# Patient Record
Sex: Female | Born: 1960 | Hispanic: Yes | Smoking: Former smoker
Health system: Southern US, Community
[De-identification: ages and names within clinical notes are randomized; demographics above are authoritative.]

## PROBLEM LIST (undated history)

## (undated) DIAGNOSIS — L509 Urticaria, unspecified: Secondary | ICD-10-CM

## (undated) DIAGNOSIS — I10 Essential (primary) hypertension: Secondary | ICD-10-CM

## (undated) DIAGNOSIS — E119 Type 2 diabetes mellitus without complications: Secondary | ICD-10-CM

## (undated) DIAGNOSIS — E1140 Type 2 diabetes mellitus with diabetic neuropathy, unspecified: Secondary | ICD-10-CM

## (undated) DIAGNOSIS — E782 Mixed hyperlipidemia: Secondary | ICD-10-CM

## (undated) DIAGNOSIS — B009 Herpesviral infection, unspecified: Secondary | ICD-10-CM

## (undated) DIAGNOSIS — J309 Allergic rhinitis, unspecified: Secondary | ICD-10-CM

## (undated) DIAGNOSIS — A6004 Herpesviral vulvovaginitis: Secondary | ICD-10-CM

## (undated) DIAGNOSIS — E7800 Pure hypercholesterolemia, unspecified: Secondary | ICD-10-CM

## (undated) DIAGNOSIS — I729 Aneurysm of unspecified site: Secondary | ICD-10-CM

## (undated) DIAGNOSIS — B349 Viral infection, unspecified: Secondary | ICD-10-CM

## (undated) DIAGNOSIS — E039 Hypothyroidism, unspecified: Secondary | ICD-10-CM

## (undated) DIAGNOSIS — M25562 Pain in left knee: Secondary | ICD-10-CM

## (undated) HISTORY — DX: Type 2 diabetes mellitus with diabetic neuropathy, unspecified: E11.40

## (undated) HISTORY — DX: Allergic rhinitis, unspecified: J30.9

## (undated) HISTORY — DX: Urticaria, unspecified: L50.9

## (undated) HISTORY — DX: Mixed hyperlipidemia: E78.2

## (undated) HISTORY — DX: Pure hypercholesterolemia, unspecified: E78.00

## (undated) HISTORY — DX: Essential (primary) hypertension: I10

## (undated) HISTORY — DX: Type 2 diabetes mellitus without complications: E11.9

## (undated) HISTORY — DX: Viral infection, unspecified: B34.9

## (undated) HISTORY — DX: Herpesviral vulvovaginitis: A60.04

## (undated) HISTORY — DX: Herpesviral infection, unspecified: B00.9

## (undated) HISTORY — DX: Aneurysm of unspecified site: I72.9

## (undated) HISTORY — DX: Hypothyroidism, unspecified: E03.9

## (undated) HISTORY — DX: Pain in left knee: M25.562

---

## 2019-10-16 ENCOUNTER — Ambulatory Visit: Payer: Medicaid Other | Attending: Internal Medicine

## 2021-07-10 ENCOUNTER — Other Ambulatory Visit: Payer: Self-pay | Admitting: Family Medicine

## 2021-07-10 DIAGNOSIS — M5136 Other intervertebral disc degeneration, lumbar region: Secondary | ICD-10-CM

## 2021-07-17 ENCOUNTER — Ambulatory Visit
Admission: RE | Admit: 2021-07-17 | Discharge: 2021-07-17 | Disposition: A | Discharge status: Home / Self Care | Payer: Medicaid Other | Source: Ambulatory Visit | Attending: Family Medicine | Admitting: Family Medicine

## 2021-07-17 ENCOUNTER — Other Ambulatory Visit: Payer: Self-pay

## 2021-07-17 DIAGNOSIS — M5136 Other intervertebral disc degeneration, lumbar region: Secondary | ICD-10-CM | POA: Diagnosis present

## 2021-10-19 DIAGNOSIS — M2569 Stiffness of other specified joint, not elsewhere classified: Secondary | ICD-10-CM | POA: Insufficient documentation

## 2021-10-19 DIAGNOSIS — M545 Low back pain, unspecified: Secondary | ICD-10-CM | POA: Insufficient documentation

## 2021-10-24 ENCOUNTER — Ambulatory Visit: Payer: Medicaid Other | Admitting: Cardiovascular Disease

## 2021-10-25 ENCOUNTER — Encounter: Payer: Self-pay | Admitting: Cardiovascular Disease

## 2021-10-25 NOTE — Progress Notes (Unsigned)
NO SHOW

## 2022-05-02 DIAGNOSIS — M51369 Other intervertebral disc degeneration, lumbar region without mention of lumbar back pain or lower extremity pain: Secondary | ICD-10-CM | POA: Insufficient documentation

## 2022-05-02 DIAGNOSIS — M5136 Other intervertebral disc degeneration, lumbar region: Secondary | ICD-10-CM | POA: Insufficient documentation

## 2022-05-02 DIAGNOSIS — M5416 Radiculopathy, lumbar region: Secondary | ICD-10-CM | POA: Insufficient documentation

## 2022-05-02 DIAGNOSIS — M48061 Spinal stenosis, lumbar region without neurogenic claudication: Secondary | ICD-10-CM | POA: Insufficient documentation

## 2022-05-20 ENCOUNTER — Emergency Department: Payer: Medicaid Other

## 2022-05-20 ENCOUNTER — Emergency Department
Admission: EM | Admit: 2022-05-20 | Discharge: 2022-05-20 | Disposition: A | Discharge status: Home / Self Care | Payer: Medicaid Other | Attending: Emergency Medicine | Admitting: Emergency Medicine

## 2022-05-20 ENCOUNTER — Other Ambulatory Visit: Payer: Self-pay

## 2022-05-20 DIAGNOSIS — M545 Low back pain, unspecified: Secondary | ICD-10-CM

## 2022-05-20 DIAGNOSIS — M5441 Lumbago with sciatica, right side: Secondary | ICD-10-CM | POA: Insufficient documentation

## 2022-05-20 DIAGNOSIS — R109 Unspecified abdominal pain: Secondary | ICD-10-CM | POA: Diagnosis not present

## 2022-05-20 DIAGNOSIS — G8929 Other chronic pain: Secondary | ICD-10-CM | POA: Diagnosis not present

## 2022-05-20 LAB — COMPREHENSIVE METABOLIC PANEL
ALT: 14 U/L (ref 0–44)
AST: 20 U/L (ref 15–41)
Albumin: 3.8 g/dL (ref 3.5–5.0)
Alkaline Phosphatase: 45 U/L (ref 38–126)
Anion gap: 8 (ref 5–15)
BUN: 24 mg/dL — ABNORMAL HIGH (ref 8–23)
CO2: 24 mmol/L (ref 22–32)
Calcium: 8.6 mg/dL — ABNORMAL LOW (ref 8.9–10.3)
Chloride: 104 mmol/L (ref 98–111)
Creatinine, Ser: 0.91 mg/dL (ref 0.44–1.00)
GFR, Estimated: >60 mL/min (ref >60)
Glucose, Bld: 156 mg/dL — ABNORMAL HIGH (ref 70–99)
Potassium: 3.6 mmol/L (ref 3.5–5.1)
Sodium: 136 mmol/L (ref 135–145)
Total Bilirubin: 0.7 mg/dL (ref 0.3–1.2)
Total Protein: 6.7 g/dL (ref 6.5–8.1)

## 2022-05-20 LAB — CBC WITH DIFFERENTIAL/PLATELET
Abs Immature Granulocytes: 0.04 10*3/uL (ref 0.00–0.07)
Basophils Absolute: 0 10*3/uL (ref 0.0–0.1)
Basophils Relative: 0 %
Eosinophils Absolute: 0.2 10*3/uL (ref 0.0–0.5)
Eosinophils Relative: 2 %
HCT: 47.6 % — ABNORMAL HIGH (ref 36.0–46.0)
Hemoglobin: 15.3 g/dL — ABNORMAL HIGH (ref 12.0–15.0)
Immature Granulocytes: 0 %
Lymphocytes Relative: 36 %
Lymphs Abs: 3.3 10*3/uL (ref 0.7–4.0)
MCH: 29.6 pg (ref 26.0–34.0)
MCHC: 32.1 g/dL (ref 30.0–36.0)
MCV: 92.1 fL (ref 80.0–100.0)
Monocytes Absolute: 0.7 10*3/uL (ref 0.1–1.0)
Monocytes Relative: 8 %
Neutro Abs: 4.9 10*3/uL (ref 1.7–7.7)
Neutrophils Relative %: 54 %
Platelets: 202 10*3/uL (ref 150–400)
RBC: 5.17 MIL/uL — ABNORMAL HIGH (ref 3.87–5.11)
RDW: 14.1 % (ref 11.5–15.5)
WBC: 9.2 10*3/uL (ref 4.0–10.5)
nRBC: 0 % (ref 0.0–0.2)

## 2022-05-20 LAB — URINALYSIS, ROUTINE W REFLEX MICROSCOPIC
Bacteria, UA: NONE SEEN
Bilirubin Urine: NEGATIVE
Glucose, UA: >=500 mg/dL — AB
Hgb urine dipstick: NEGATIVE
Ketones, ur: NEGATIVE mg/dL
Leukocytes,Ua: NEGATIVE
Nitrite: NEGATIVE
Protein, ur: NEGATIVE mg/dL
Specific Gravity, Urine: 1.021 (ref 1.005–1.030)
Squamous Epithelial / HPF: NONE SEEN (ref 0–5)
pH: 5 (ref 5.0–8.0)

## 2022-05-20 MED ORDER — METHOCARBAMOL 500 MG PO TABS
500.0000 mg | ORAL_TABLET | Freq: Once | ORAL | Status: AC
  Administered 2022-05-20: 500 mg via ORAL
  Filled 2022-05-20 (×2): qty 1

## 2022-05-20 MED ORDER — KETOROLAC TROMETHAMINE 30 MG/ML IJ SOLN
30.0000 mg | Freq: Once | INTRAMUSCULAR | Status: AC
  Administered 2022-05-20: 30 mg via INTRAMUSCULAR
  Filled 2022-05-20: qty 1

## 2022-05-20 MED ORDER — ACETAMINOPHEN 500 MG PO TABS
1000.0000 mg | ORAL_TABLET | Freq: Once | ORAL | Status: AC
  Administered 2022-05-20: 1000 mg via ORAL
  Filled 2022-05-20: qty 2

## 2022-05-20 MED ORDER — METHOCARBAMOL 500 MG PO TABS: 500.0000 mg | ORAL_TABLET | Freq: Three times a day (TID) | ORAL | 0 refills | Status: DC | PRN

## 2022-05-20 MED ORDER — LIDOCAINE 5 % EX PTCH
1.0000 | MEDICATED_PATCH | CUTANEOUS | Status: DC
  Administered 2022-05-20: 1 via TRANSDERMAL
  Filled 2022-05-20: qty 1

## 2022-05-20 MED ORDER — LIDOCAINE 5 % EX PTCH: 1.0000 | MEDICATED_PATCH | Freq: Two times a day (BID) | CUTANEOUS | 0 refills | Status: AC

## 2022-05-20 NOTE — ED Provider Notes (Signed)
[facility] Provider Note    Event Date/Time   First MD Initiated Contact with Patient 05/20/22 0121     (approximate)   History   Flank Pain   HPI  Zemirah Krasinski is a 61 y.o. female who presents to the ED for evaluation of back Pain   I reviewed orthopedic visit from 10/13.  Obese patient with history of chronic low back pain with right-sided sciatica.  Lumbar spinal stenosis on MRI lumbar spine from 10 months ago.  On 10/13, she reports getting a spinal injection for this.  She presents to the ED tonight, accompanied by her daughter, for evaluation of acute on chronic right-sided lumbar pain.  She was noted to be triaged as "flank pain" but she is gesturing to her right-sided lumbar back and reports "the pain went away for couple days after the injection, but came right back."  Denies any falls or injuries, fevers or urinary or stool retention.  No saddle anesthesias.  Reports the pain was becoming unbearable today.  Physical Exam   Triage Vital Signs: ED Triage Vitals  Enc Vitals Group     BP 05/20/22 0045 99/66     Pulse Rate 05/20/22 0045 89     Resp 05/20/22 0045 16     Temp 05/20/22 0045 98.2 F (36.8 C)     Temp Source 05/20/22 0045 Oral     SpO2 05/20/22 0045 96 %     Weight 05/20/22 0043 140 lb (63.5 kg)     Height 05/20/22 0043 5\' 2"  (1.575 m)     Head Circumference --      Peak Flow --      Pain Score 05/20/22 0043 5     Pain Loc --      Pain Edu? --      Excl. in [place]? --     Most recent vital signs: Vitals:   05/20/22 0045 05/20/22 0310  BP: 99/66 116/66  Pulse: 89 84  Resp: 16 16  Temp: 98.2 F (36.8 C)   SpO2: 96% 98%    General: Awake, no distress.  Obese, seems uncomfortable. CV:  Good peripheral perfusion.  Resp:  Normal effort.  Abd:  No distention.  MSK:  No deformity noted.  No signs of trauma or skin changes.  Right-sided paraspinal lumbar tenderness is noted.  No midline tenderness or step-offs  Neuro:  No  focal deficits appreciated. Other:     ED Results / Procedures / Treatments   Labs (all labs ordered are listed, but only abnormal results are displayed) Labs Reviewed  CBC WITH DIFFERENTIAL/PLATELET - Abnormal; Notable for the following components:      Result Value   RBC 5.17 (*)    Hemoglobin 15.3 (*)    HCT 47.6 (*)    All other components within normal limits  COMPREHENSIVE METABOLIC PANEL - Abnormal; Notable for the following components:   Glucose, Bld 156 (*)    BUN 24 (*)    Calcium 8.6 (*)    All other components within normal limits  URINALYSIS, ROUTINE W REFLEX MICROSCOPIC - Abnormal; Notable for the following components:   Color, Urine YELLOW (*)    APPearance CLEAR (*)    Glucose, UA >=500 (*)    All other components within normal limits  POC URINE PREG, ED    EKG   RADIOLOGY CT renal study interpreted by me without evidence of acute pathology or urologic stone  Official radiology report(s): CT Renal Stone  Study  Result Date: 05/20/2022 CLINICAL DATA:  Flank pain.  Concern for kidney stone. EXAM: CT ABDOMEN AND PELVIS WITHOUT CONTRAST TECHNIQUE: Multidetector CT imaging of the abdomen and pelvis was performed following the standard protocol without IV contrast. RADIATION DOSE REDUCTION: This exam was performed according to the departmental dose-optimization program which includes automated exposure control, adjustment of the mA and/or kV according to patient size and/or use of iterative reconstruction technique. COMPARISON:  None Available. FINDINGS: Evaluation of this exam is limited in the absence of intravenous contrast. Lower chest: The visualized lung bases are clear. There is coronary vascular calcification. No intra-abdominal free air or free fluid. Hepatobiliary: The liver is unremarkable. No biliary dilatation. The gallbladder is contracted. No calcified gallstone or pericholecystic fluid. Pancreas: Unremarkable. No pancreatic ductal dilatation or  surrounding inflammatory changes. Spleen: Normal in size without focal abnormality. Adrenals/Urinary Tract: The adrenal glands are unremarkable. There is no hydronephrosis or nephrolithiasis on either side. The visualized ureters and urinary bladder appear unremarkable. Stomach/Bowel: There is moderate stool throughout the colon. There is sigmoid diverticulosis and scattered colonic diverticula. No acute inflammatory changes. There is no bowel obstruction or active inflammation. The appendix is normal. Vascular/Lymphatic: Mild aortoiliac atherosclerotic disease. The IVC is unremarkable. No portal venous gas. There is no adenopathy. Reproductive: The uterus is grossly unremarkable. No adnexal masses. Other: Midline vertical anterior pelvic wall incisional scar. Musculoskeletal: Degenerative changes of the spine. No acute osseous pathology. IMPRESSION: 1. No acute intra-abdominal or pelvic pathology. No hydronephrosis or nephrolithiasis. 2. Colonic diverticulosis. No bowel obstruction. Normal appendix. 3.  Aortic Atherosclerosis (ICD10-I70.0). Electronically Signed   By: Anner Crete M.D.   On: 05/20/2022 01:10    PROCEDURES and INTERVENTIONS:  Procedures  Medications  lidocaine (LIDODERM) 5 % 1 patch (1 patch Transdermal Patch Applied 05/20/22 0157)  acetaminophen (TYLENOL) tablet 1,000 mg (1,000 mg Oral Given 05/20/22 0156)  ketorolac (TORADOL) 30 MG/ML injection 30 mg (30 mg Intramuscular Given 05/20/22 0158)  methocarbamol (ROBAXIN) tablet 500 mg (500 mg Oral Given 05/20/22 0157)     IMPRESSION / MDM / ASSESSMENT AND PLAN / ED COURSE  I reviewed the triage vital signs and the nursing notes.  Differential diagnosis includes, but is not limited to, chronic pain, cauda equina, iatrogenic injury from injection, renal stone  {Patient presents with symptoms of an acute illness or injury that is potentially life-threatening.  61 year old woman presents with acute on chronic atraumatic back pain  suitable for outpatient management with nonnarcotic multimodal analgesia.  She looks well.  No signs of neurologic vascular deficits.  No signs of red flag features.  Screening blood work is benign with no leukocytosis or signs of sepsis.  Metabolic panel is essentially normal.  Urine without infectious features or stigmata of ureteral stones.  CT renal studies ordered from triage and without evidence of acute intra-abdominal or any ureteral pathology.  I suspect chronic MSK pain.  Improving pain with multimodal analgesia.  Discharged with the same.  Discussed return precautions.      FINAL CLINICAL IMPRESSION(S) / ED DIAGNOSES   Final diagnoses:  Right lumbar pain  Chronic right-sided low back pain with right-sided sciatica     Rx / DC Orders   ED Discharge Orders          Ordered    methocarbamol (ROBAXIN) 500 MG tablet  Every 8 hours PRN        05/20/22 0302    lidocaine (LIDODERM) 5 %  Every 12 hours  05/20/22 0302             Note:  This document was prepared using Dragon voice recognition software and may include unintentional dictation errors.   Vladimir Crofts, MD 05/20/22 [phone]

## 2022-05-20 NOTE — ED Triage Notes (Signed)
Pt arrives with c/o right sided flank pain that started about 2 days ago. Pt denies n/v or fevers.

## 2022-05-20 NOTE — Discharge Instructions (Signed)
Please take Tylenol and ibuprofen/Advil for your pain.  It is safe to take them together, or to alternate them every few hours.  Take up to 1000mg  of Tylenol at a time, up to 4 times per day.  Do not take more than 4000 mg of Tylenol in 24 hours.  For ibuprofen, take 400-600 mg, 3 - 4 times per day.  Please use lidocaine patches at your site of pain.  Apply 1 patch at a time, leave on for 12 hours, then remove for 12 hours.  12 hours on, 12 hours off.  Do not apply more than 1 patch at a time.  Use Robaxin muscle relaxers 3-4 times per day as needed for more severe/breakthrough pain.  I would reserve the hydrocodone pain medicine for the most severe pain.  Be careful mixing hydrocodone and Robaxin, this commendation can make you sleepy.

## 2022-05-20 NOTE — ED Notes (Signed)
ED Provider at bedside. 

## 2022-05-20 NOTE — ED Notes (Signed)
Signing pad did not work, pt verbalized understanding of Dc instructions. 

## 2022-06-15 ENCOUNTER — Inpatient Hospital Stay: Payer: Medicaid Other

## 2022-06-15 ENCOUNTER — Inpatient Hospital Stay: Payer: Medicaid Other | Attending: Oncology | Admitting: Oncology

## 2022-06-15 ENCOUNTER — Encounter: Payer: Self-pay | Admitting: Oncology

## 2022-06-15 VITALS — BP 123/75 | HR 79 | Temp 96.9°F | Resp 18 | Wt 141.2 lb

## 2022-06-15 DIAGNOSIS — E039 Hypothyroidism, unspecified: Secondary | ICD-10-CM | POA: Insufficient documentation

## 2022-06-15 DIAGNOSIS — D751 Secondary polycythemia: Secondary | ICD-10-CM

## 2022-06-15 DIAGNOSIS — Z7982 Long term (current) use of aspirin: Secondary | ICD-10-CM | POA: Insufficient documentation

## 2022-06-15 DIAGNOSIS — E1140 Type 2 diabetes mellitus with diabetic neuropathy, unspecified: Secondary | ICD-10-CM | POA: Diagnosis not present

## 2022-06-15 DIAGNOSIS — Z7984 Long term (current) use of oral hypoglycemic drugs: Secondary | ICD-10-CM | POA: Diagnosis not present

## 2022-06-15 DIAGNOSIS — I10 Essential (primary) hypertension: Secondary | ICD-10-CM | POA: Insufficient documentation

## 2022-06-15 DIAGNOSIS — E782 Mixed hyperlipidemia: Secondary | ICD-10-CM | POA: Insufficient documentation

## 2022-06-15 LAB — CBC WITH DIFFERENTIAL/PLATELET
Abs Immature Granulocytes: 0.02 10*3/uL (ref 0.00–0.07)
Basophils Absolute: 0 10*3/uL (ref 0.0–0.1)
Basophils Relative: 0 %
Eosinophils Absolute: 0.2 10*3/uL (ref 0.0–0.5)
Eosinophils Relative: 2 %
HCT: 50.7 % — ABNORMAL HIGH (ref 36.0–46.0)
Hemoglobin: 16.7 g/dL — ABNORMAL HIGH (ref 12.0–15.0)
Immature Granulocytes: 0 %
Lymphocytes Relative: 39 %
Lymphs Abs: 3.1 10*3/uL (ref 0.7–4.0)
MCH: 30 pg (ref 26.0–34.0)
MCHC: 32.9 g/dL (ref 30.0–36.0)
MCV: 91 fL (ref 80.0–100.0)
Monocytes Absolute: 0.5 10*3/uL (ref 0.1–1.0)
Monocytes Relative: 7 %
Neutro Abs: 4 10*3/uL (ref 1.7–7.7)
Neutrophils Relative %: 52 %
Platelets: 184 10*3/uL (ref 150–400)
RBC: 5.57 MIL/uL — ABNORMAL HIGH (ref 3.87–5.11)
RDW: 14.3 % (ref 11.5–15.5)
WBC: 7.8 10*3/uL (ref 4.0–10.5)
nRBC: 0 % (ref 0.0–0.2)

## 2022-06-15 LAB — URINALYSIS, COMPLETE (UACMP) WITH MICROSCOPIC
Bacteria, UA: NONE SEEN
Bilirubin Urine: NEGATIVE
Glucose, UA: >=500 mg/dL — AB
Hgb urine dipstick: NEGATIVE
Ketones, ur: NEGATIVE mg/dL
Leukocytes,Ua: NEGATIVE
Nitrite: NEGATIVE
Protein, ur: NEGATIVE mg/dL
Specific Gravity, Urine: 1.01 (ref 1.005–1.030)
pH: 6 (ref 5.0–8.0)

## 2022-06-15 NOTE — Progress Notes (Signed)
Hematology/Oncology Consult note [facility] Telephone:([phone] [phone]  Patient Care Team: [place] as PCP - General   Name of the patient: Ashley Delgado  [id number]  1961-02-24    Reason for referral-polycythemia   Referring physician-Susan Magnus Ivan, MD  Date of visit: 06/15/22   History of presenting illness-  patient is a 61 year old female with a past medical history significant for hypothyroidism, hypertension type 2 diabetes who has been referred for polycythemia.  Most recent CBC from 05/20/2022 showed a white count of 9.2, H&H of 15.3/47.6 and a platelet count of 202.  CMP was within normal limits.  She was noted to have an H&H of 17.8/51.5 on 05/22/2022.  White count and platelets were normal.  Patient is a lifelong non-smoker.  She reports getting a restful sleep and does not snore.  She does however wake up a few times at night mainly to use the restroom as she is on water pills as well as drinks water at night.  Denies any daytime drowsiness or fatigue.  No known baseline lung disease.  ECOG PS- 0  Pain scale- 0   Review of systems- Review of Systems  Constitutional:  Negative for chills, fever, malaise/fatigue and weight loss.  HENT:  Negative for congestion, ear discharge and nosebleeds.   Eyes:  Negative for blurred vision.  Respiratory:  Negative for cough, hemoptysis, sputum production, shortness of breath and wheezing.   Cardiovascular:  Negative for chest pain, palpitations, orthopnea and claudication.  Gastrointestinal:  Negative for abdominal pain, blood in stool, constipation, diarrhea, heartburn, melena, nausea and vomiting.  Genitourinary:  Negative for dysuria, flank pain, frequency, hematuria and urgency.  Musculoskeletal:  Negative for back pain, joint pain and myalgias.  Skin:  Negative for rash.  Neurological:  Negative for dizziness, tingling, focal weakness, seizures, weakness and headaches.   Endo/Heme/Allergies:  Does not bruise/bleed easily.  Psychiatric/Behavioral:  Negative for depression and suicidal ideas. The patient does not have insomnia.     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 RECORDS   Pain in left knee    FROM [facility] RECORDS   Pure hypercholesterolemia, unspecified    FROM [facility] RECORDS   Type 2 diabetes mellitus with diabetic neuropathy ([place])    FROM [facility] RECORDS   Type 2 diabetes mellitus without complication ([place])    FROM [facility] RECORDS   Urticaria, unspecified    FROM [facility] RECORDS   Viral infection    FROM [facility] RECORDS     History reviewed. No pertinent surgical history.  Social History   Socioeconomic History   Marital status: Unknown    Spouse name: Not on file   Number of children: Not on file   Years of education: Not on file   Highest education level: Not on  file  Occupational History   Not on file  Tobacco Use   Smoking status: Former    Packs/day: 1.00    Types: Cigarettes   Smokeless tobacco: Never  Substance and Sexual Activity   Alcohol use: Yes    Comment: occasional drinking   Drug use: Not on file   Sexual activity: Yes  Other Topics Concern   Not on file  Social History Narrative   Not on file   Social Determinants of Health   Financial Resource Strain: Not on file  Food Insecurity: Not on file   Transportation Needs: Not on file  Physical Activity: Not on file  Stress: Not on file  Social Connections: Not on file  Intimate Partner Violence: Not on file     Family History  Problem Relation Age of Onset   Heart attack Mother    Diabetes Mother    Hypertension Mother    Heart failure Father    Stroke Father    Diabetes Father    Hypertension Father    Hypertension Brother    Stroke Brother    Heart attack Brother    Leukemia Daughter    Thyroid cancer Daughter      Current Outpatient Medications:    acyclovir (ZOVIRAX) 400 MG tablet, Take 400 mg by mouth daily., Disp: , Rfl:    aspirin EC 81 MG tablet, Take 81 mg by mouth daily. Swallow whole., Disp: , Rfl:    gabapentin (NEURONTIN) 300 MG capsule, one capsule twice a day, Disp: , Rfl:    hydrochlorothiazide (HYDRODIURIL) 25 MG tablet, Take 25 mg by mouth daily., Disp: , Rfl:    HYDROcodone-acetaminophen (NORCO/VICODIN) 5-325 MG tablet, , Disp: , Rfl:    JARDIANCE 25 MG TABS tablet, Take 25 mg by mouth daily., Disp: , Rfl:    levothyroxine (SYNTHROID) 112 MCG tablet, Take 112 mcg by mouth daily., Disp: , Rfl:    lisinopril (ZESTRIL) 40 MG tablet, , Disp: , Rfl:    metFORMIN (GLUCOPHAGE) 1000 MG tablet, Take 1,000 mg by mouth 2 (two) times daily., Disp: , Rfl:    methocarbamol (ROBAXIN) 500 MG tablet, Take 1 tablet (500 mg total) by mouth every 8 (eight) hours as needed for muscle spasms., Disp: 20 tablet, Rfl: 0   rosuvastatin (CRESTOR) 10 MG tablet, Take 10 mg by mouth at bedtime., Disp: , Rfl:    TRULICITY 4.5 MG/0.5ML SOPN, Inject into the skin., Disp: , Rfl:    lidocaine (LIDODERM) 5 %, Place 1 patch onto the skin every 12 (twelve) hours. Remove & Discard patch within 12 hours or as directed by MD, Disp: 10 patch, Rfl: 0   meloxicam (MOBIC) 15 MG tablet, Take 1 tablet every day by oral route. (Patient not taking: Reported on 06/15/2022), Disp: , Rfl:    mirtazapine (REMERON) 30 MG tablet, as needed. (Patient not  taking: Reported on 06/15/2022), Disp: , Rfl:    montelukast (SINGULAIR) 10 MG tablet, , Disp: , Rfl:    pantoprazole (PROTONIX) 40 MG tablet, , Disp: , Rfl:    TOUJEO SOLOSTAR 300 UNIT/ML Solostar Pen, Inject into the skin., Disp: , Rfl:    Physical exam:  Vitals:   06/15/22 1108  BP: 123/75  Pulse: 79  Resp: 18  Temp: (!) 96.9 F (36.1 C)  SpO2: 99%  Weight: 141 lb 3.2 oz (64 kg)   Physical Exam Cardiovascular:     Rate and Rhythm: Normal rate and regular rhythm.     Heart sounds: Normal  heart sounds.  Pulmonary:     Effort: Pulmonary effort is normal.     Breath sounds: Normal breath sounds.  Abdominal:     General: Bowel sounds are normal.     Palpations: Abdomen is soft.     Comments: No palpable hepatosplenomegaly  Skin:    General: Skin is warm and dry.  Neurological:     Mental Status: She is alert and oriented to person, place, and time.           Latest Ref Rng & Units 05/20/2022   12:45 AM  CMP  Glucose 70 - 99 mg/dL 156   BUN 8 - 23 mg/dL 24   Creatinine 0.44 - 1.00 mg/dL 0.91   Sodium 135 - 145 mmol/L 136   Potassium 3.5 - 5.1 mmol/L 3.6   Chloride 98 - 111 mmol/L 104   CO2 22 - 32 mmol/L 24   Calcium 8.9 - 10.3 mg/dL 8.6   Total Protein 6.5 - 8.1 g/dL 6.7   Total Bilirubin 0.3 - 1.2 mg/dL 0.7   Alkaline Phos 38 - 126 U/L 45   AST 15 - 41 U/L 20   ALT 0 - 44 U/L 14       Latest Ref Rng & Units 05/20/2022   12:45 AM  CBC  WBC 4.0 - 10.5 K/uL 9.2   Hemoglobin 12.0 - 15.0 g/dL 15.3   Hematocrit 36.0 - 46.0 % 47.6   Platelets 150 - 400 K/uL 202     No images are attached to the encounter.  CT Renal Stone Study  Result Date: 05/20/2022 CLINICAL DATA:  Flank pain.  Concern for kidney stone. EXAM: CT ABDOMEN AND PELVIS WITHOUT CONTRAST TECHNIQUE: Multidetector CT imaging of the abdomen and pelvis was performed following the standard protocol without IV contrast. RADIATION DOSE REDUCTION: This exam was performed according to the departmental  dose-optimization program which includes automated exposure control, adjustment of the mA and/or kV according to patient size and/or use of iterative reconstruction technique. COMPARISON:  None Available. FINDINGS: Evaluation of this exam is limited in the absence of intravenous contrast. Lower chest: The visualized lung bases are clear. There is coronary vascular calcification. No intra-abdominal free air or free fluid. Hepatobiliary: The liver is unremarkable. No biliary dilatation. The gallbladder is contracted. No calcified gallstone or pericholecystic fluid. Pancreas: Unremarkable. No pancreatic ductal dilatation or surrounding inflammatory changes. Spleen: Normal in size without focal abnormality. Adrenals/Urinary Tract: The adrenal glands are unremarkable. There is no hydronephrosis or nephrolithiasis on either side. The visualized ureters and urinary bladder appear unremarkable. Stomach/Bowel: There is moderate stool throughout the colon. There is sigmoid diverticulosis and scattered colonic diverticula. No acute inflammatory changes. There is no bowel obstruction or active inflammation. The appendix is normal. Vascular/Lymphatic: Mild aortoiliac atherosclerotic disease. The IVC is unremarkable. No portal venous gas. There is no adenopathy. Reproductive: The uterus is grossly unremarkable. No adnexal masses. Other: Midline vertical anterior pelvic wall incisional scar. Musculoskeletal: Degenerative changes of the spine. No acute osseous pathology. IMPRESSION: 1. No acute intra-abdominal or pelvic pathology. No hydronephrosis or nephrolithiasis. 2. Colonic diverticulosis. No bowel obstruction. Normal appendix. 3.  Aortic Atherosclerosis (ICD10-I70.0). Electronically Signed   By: Anner Crete M.D.   On: 05/20/2022 01:10    Assessment and plan- Patient is a 61 y.o. female referred for polycythemia  Unclear if polycythemia is real or spurious given that patient's hemoglobin was 15.3 on 05/20/2022 and  just 2 days later it was 17.8.  I plan to  repeat his CBC with differential at this time along with JAK2 levels and EPO levels and urinalysis.  In person or video visit with me in 3 weeks time.  Discussed differences between polycythemia vera and secondary polycythemia.   Thank you for this kind referral and the opportunity to participate in the care of this patient   Visit Diagnosis 1. Polycythemia     Dr. Randa Evens, MD, MPH [facility] at [facility] [id number] 06/15/2022 2:25 PM

## 2022-06-16 LAB — ERYTHROPOIETIN: Erythropoietin: 14.7 m[IU]/mL (ref 2.6–18.5)

## 2022-06-27 LAB — JAK2 GENOTYPR

## 2022-07-09 ENCOUNTER — Inpatient Hospital Stay: Payer: Medicaid Other | Attending: Oncology | Admitting: Oncology

## 2022-07-09 ENCOUNTER — Encounter: Payer: Self-pay | Admitting: Oncology

## 2022-07-31 ENCOUNTER — Inpatient Hospital Stay: Payer: Medicaid Other

## 2022-07-31 ENCOUNTER — Inpatient Hospital Stay: Payer: Medicaid Other | Attending: Oncology | Admitting: Oncology

## 2022-07-31 VITALS — BP 126/77 | HR 88 | Temp 98.6°F | Ht 62.0 in | Wt 138.8 lb

## 2022-07-31 DIAGNOSIS — I10 Essential (primary) hypertension: Secondary | ICD-10-CM | POA: Diagnosis not present

## 2022-07-31 DIAGNOSIS — Z7984 Long term (current) use of oral hypoglycemic drugs: Secondary | ICD-10-CM | POA: Insufficient documentation

## 2022-07-31 DIAGNOSIS — Z79899 Other long term (current) drug therapy: Secondary | ICD-10-CM | POA: Diagnosis not present

## 2022-07-31 DIAGNOSIS — E039 Hypothyroidism, unspecified: Secondary | ICD-10-CM | POA: Diagnosis not present

## 2022-07-31 DIAGNOSIS — D751 Secondary polycythemia: Secondary | ICD-10-CM

## 2022-07-31 DIAGNOSIS — E782 Mixed hyperlipidemia: Secondary | ICD-10-CM | POA: Diagnosis not present

## 2022-07-31 DIAGNOSIS — E1140 Type 2 diabetes mellitus with diabetic neuropathy, unspecified: Secondary | ICD-10-CM | POA: Insufficient documentation

## 2022-07-31 LAB — CBC WITH DIFFERENTIAL/PLATELET
Abs Immature Granulocytes: 0.02 10*3/uL (ref 0.00–0.07)
Basophils Absolute: 0 10*3/uL (ref 0.0–0.1)
Basophils Relative: 0 %
Eosinophils Absolute: 0.3 10*3/uL (ref 0.0–0.5)
Eosinophils Relative: 4 %
HCT: 48.2 % — ABNORMAL HIGH (ref 36.0–46.0)
Hemoglobin: 16 g/dL — ABNORMAL HIGH (ref 12.0–15.0)
Immature Granulocytes: 0 %
Lymphocytes Relative: 42 %
Lymphs Abs: 3 10*3/uL (ref 0.7–4.0)
MCH: 29.8 pg (ref 26.0–34.0)
MCHC: 33.2 g/dL (ref 30.0–36.0)
MCV: 89.8 fL (ref 80.0–100.0)
Monocytes Absolute: 0.5 10*3/uL (ref 0.1–1.0)
Monocytes Relative: 7 %
Neutro Abs: 3.3 10*3/uL (ref 1.7–7.7)
Neutrophils Relative %: 47 %
Platelets: 175 10*3/uL (ref 150–400)
RBC: 5.37 MIL/uL — ABNORMAL HIGH (ref 3.87–5.11)
RDW: 14.4 % (ref 11.5–15.5)
WBC: 7.2 10*3/uL (ref 4.0–10.5)
nRBC: 0 % (ref 0.0–0.2)

## 2022-08-01 ENCOUNTER — Encounter: Payer: Self-pay | Admitting: Oncology

## 2022-08-01 NOTE — Progress Notes (Signed)
Hematology/Oncology Consult note [facility]  Telephone:([phone] [phone]  Patient Care Team: [place], [place] as PCP - General   Name of the patient: Ashley Delgado  [id number]  [birth year]   Date of visit: 08/01/22  Diagnosis-secondary polycythemia of unclear etiology  Chief complaint/ Reason for visit-routine follow-up of polycythemia  Heme/Onc history: patient is a 62 year old female with a past medical history significant for hypothyroidism, hypertension type 2 diabetes who has been referred for polycythemia.  Most recent CBC from 05/20/2022 showed a white count of 9.2, H&H of 15.3/47.6 and a platelet count of 202.  CMP was within normal limits.  She was noted to have an H&H of 17.8/51.5 on 05/22/2022.  White count and platelets were normal.   Patient is a lifelong non-smoker.  She reports getting a restful sleep and does not snore.  She does however wake up a few times at night mainly to use the restroom as she is on water pills as well as drinks water at night.  Denies any daytime drowsiness or fatigue.  No known baseline lung disease.  Results of blood work from 06/15/2022 showed an H&H of 16.7/50.7 with a normal white count and platelet count.  JAK2 mutation was negative.  EPO levels were normal at 14.7.  Urinalysis did not show any hematuria.  Interval history-patient recovered from a bout of URI but is overall doing well.  Denies any specific complaints at this time.  ECOG PS- 1 Pain scale- 0   Review of systems- Review of Systems  Constitutional:  Positive for malaise/fatigue. Negative for chills, fever and weight loss.  HENT:  Negative for congestion, ear discharge and nosebleeds.   Eyes:  Negative for blurred vision.  Respiratory:  Negative for cough, hemoptysis, sputum production, shortness of breath and wheezing.   Cardiovascular:  Negative for chest pain, palpitations, orthopnea and claudication.  Gastrointestinal:  Negative  for abdominal pain, blood in stool, constipation, diarrhea, heartburn, melena, nausea and vomiting.  Genitourinary:  Negative for dysuria, flank pain, frequency, hematuria and urgency.  Musculoskeletal:  Negative for back pain, joint pain and myalgias.  Skin:  Negative for rash.  Neurological:  Negative for dizziness, tingling, focal weakness, seizures, weakness and headaches.  Endo/Heme/Allergies:  Does not bruise/bleed easily.  Psychiatric/Behavioral:  Negative for depression and suicidal ideas. The patient does not have insomnia.       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[place])    FROM [facility] RECORDS   Essential hypertension    Herpesviral infection    FROM [facility] RECORDS   Herpetic vulvovaginitis    FROM [facility] RECORDS   Hypothyroidism    FROM [facility] RECORDS   Mixed hypercholesterolemia and hypertriglyceridemia    FROM [facility] RECORDS   Pain in left knee    FROM [facility] RECORDS   Pure hypercholesterolemia, unspecified    FROM [facility] RECORDS   Type 2 diabetes mellitus with diabetic neuropathy ([place])    FROM [facility] RECORDS   Type 2 diabetes mellitus without complication ([place])    FROM [facility] RECORDS   Urticaria, unspecified    FROM [facility] RECORDS   Viral infection    FROM [facility] RECORDS     No past surgical history on file.  Social History   Socioeconomic History   Marital status: Unknown    Spouse name: Not  on file   Number of children: Not on file   Years of education: Not on file   Highest education level: Not on file  Occupational History   Not on file  Tobacco Use   Smoking status: Former    Packs/day: 1.00    Types: Cigarettes   Smokeless tobacco: Never  Substance and Sexual Activity   Alcohol use: Yes    Comment: occasional drinking   Drug use: Not on file   Sexual activity: Yes  Other Topics Concern   Not on file  Social History Narrative   Not  on file   Social Determinants of Health   Financial Resource Strain: Not on file  Food Insecurity: Not on file  Transportation Needs: Not on file  Physical Activity: Not on file  Stress: Not on file  Social Connections: Not on file  Intimate Partner Violence: Not on file    Family History  Problem Relation Age of Onset   Heart attack Mother    Diabetes Mother    Hypertension Mother    Heart failure Father    Stroke Father    Diabetes Father    Hypertension Father    Hypertension Brother    Stroke Brother    Heart attack Brother    Leukemia Daughter    Thyroid cancer Daughter      Current Outpatient Medications:    acyclovir (ZOVIRAX) 400 MG tablet, Take 400 mg by mouth daily., Disp: , Rfl:    aspirin EC 81 MG tablet, Take 81 mg by mouth daily. Swallow whole., Disp: , Rfl:    gabapentin (NEURONTIN) 300 MG capsule, one capsule twice a day, Disp: , Rfl:    hydrochlorothiazide (HYDRODIURIL) 25 MG tablet, Take 25 mg by mouth daily., Disp: , Rfl:    HYDROcodone-acetaminophen (NORCO/VICODIN) 5-325 MG tablet, , Disp: , Rfl:    JARDIANCE 25 MG TABS tablet, Take 25 mg by mouth daily., Disp: , Rfl:    levothyroxine (SYNTHROID) 112 MCG tablet, Take 112 mcg by mouth daily., Disp: , Rfl:    lidocaine (LIDODERM) 5 %, Place 1 patch onto the skin every 12 (twelve) hours. Remove & Discard patch within 12 hours or as directed by MD, Disp: 10 patch, Rfl: 0   lisinopril (ZESTRIL) 40 MG tablet, , Disp: , Rfl:    metFORMIN (GLUCOPHAGE) 1000 MG tablet, Take 1,000 mg by mouth 2 (two) times daily., Disp: , Rfl:    methocarbamol (ROBAXIN) 500 MG tablet, Take 1 tablet (500 mg total) by mouth every 8 (eight) hours as needed for muscle spasms., Disp: 20 tablet, Rfl: 0   rosuvastatin (CRESTOR) 10 MG tablet, Take 10 mg by mouth at bedtime., Disp: , Rfl:    TOUJEO SOLOSTAR 300 UNIT/ML Solostar Pen, Inject into the skin., Disp: , Rfl:    TRULICITY 4.5 VE/9.3YB SOPN, Inject into the skin., Disp: , Rfl:     meloxicam (MOBIC) 15 MG tablet, Take 1 tablet every day by oral route. (Patient not taking: Reported on 06/15/2022), Disp: , Rfl:    mirtazapine (REMERON) 30 MG tablet, as needed. (Patient not taking: Reported on 06/15/2022), Disp: , Rfl:    montelukast (SINGULAIR) 10 MG tablet, , Disp: , Rfl:    pantoprazole (PROTONIX) 40 MG tablet, , Disp: , Rfl:   Physical exam:  Vitals:   07/31/22 1500  BP: 126/77  Pulse: 88  Temp: 98.6 F (37 C)  TempSrc: Tympanic  Weight: 138 lb 12.8 oz (63 kg)  Height: _0  (1.575  m)   Physical Exam Cardiovascular:     Rate and Rhythm: Normal rate and regular rhythm.     Heart sounds: Normal heart sounds.  Pulmonary:     Effort: Pulmonary effort is normal.     Breath sounds: Normal breath sounds.  Skin:    General: Skin is warm and dry.  Neurological:     Mental Status: She is alert and oriented to person, place, and time.         Latest Ref Rng & Units 05/20/2022   12:45 AM  CMP  Glucose 70 - 99 mg/dL 156   BUN 8 - 23 mg/dL 24   Creatinine 0.44 - 1.00 mg/dL 0.91   Sodium 135 - 145 mmol/L 136   Potassium 3.5 - 5.1 mmol/L 3.6   Chloride 98 - 111 mmol/L 104   CO2 22 - 32 mmol/L 24   Calcium 8.9 - 10.3 mg/dL 8.6   Total Protein 6.5 - 8.1 g/dL 6.7   Total Bilirubin 0.3 - 1.2 mg/dL 0.7   Alkaline Phos 38 - 126 U/L 45   AST 15 - 41 U/L 20   ALT 0 - 44 U/L 14       Latest Ref Rng & Units 07/31/2022    4:01 PM  CBC  WBC 4.0 - 10.5 K/uL 7.2   Hemoglobin 12.0 - 15.0 g/dL 16.0   Hematocrit 36.0 - 46.0 % 48.2   Platelets 150 - 400 K/uL 175     Assessment and plan- Patient is a 62 y.o. female here for routine follow-up of secondary polycythemia  Patient's hemoglobin has been fluctuating between 15.5-16.5 which is slightly above the normal range.  EPO levels are normal and JAK2 mutation is negative thereby making polycythemia vera unlikely.  I am doing additional workup today including CALR, MPL and exon 12 mutation testing and repeating her CBC  today.  I will hold off on bone marrow biopsy at this time.  I will check her CBC again in 3 months in 6 months and see her back in 6 months.  Patient also does not have any overt risk factors for secondary polycythemia.  She does not have any baseline lung disease, she is a lifelong non-smoker and reports getting a good night sleep.  If there is a consistent increase in her hemoglobin and workup above is negative she may need a sleep study to rule out obstructive sleep apnea as a cause of secondary polycythemia.   Visit Diagnosis 1. Polycythemia      Dr. Randa Evens, MD, MPH [facility] at [facility] [phone] 08/01/2022 8:24 AM

## 2022-08-08 LAB — NGS JAK2 EXONS 12-15

## 2022-08-08 LAB — MPL MUTATION ANALYSIS

## 2022-08-09 LAB — CALRETICULIN (CALR) MUTATION ANALYSIS

## 2022-10-29 ENCOUNTER — Inpatient Hospital Stay: Payer: Medicaid Other | Attending: Oncology

## 2022-12-12 IMAGING — MR MR LUMBAR SPINE W/O CM
5 series · 30 of 48 positions shown · non-contrast
Comparison: None.

CLINICAL DATA: Lower back pain

EXAM:
MRI LUMBAR SPINE WITHOUT CONTRAST
TECHNIQUE: Multiplanar, multisequence MR imaging of the lumbar spine was
performed. No intravenous contrast was administered.

[Series 5: T2 · sagittal · 4.0mm · 0.81mm/px · 6 of 17 slices shown (1 of 2)]
[im 1/17]
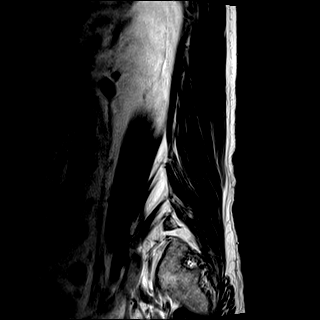
[im 4/17]
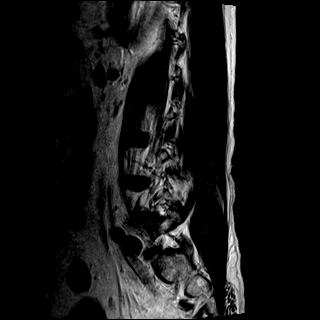
[im 7/17]
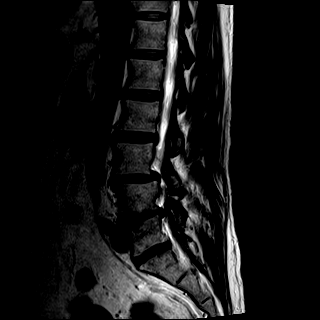
[im 10/17]
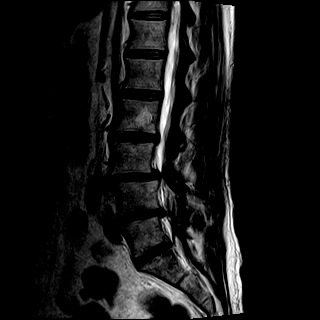
[im 13/17]
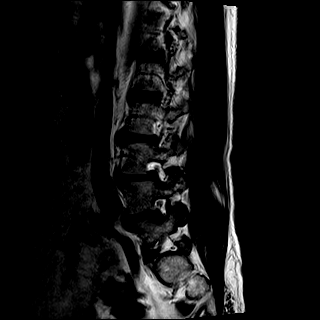
[im 17/17]
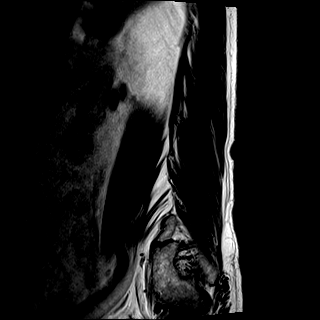

[Series 6: T1 · sagittal · 4.0mm · 0.81mm/px · 7 of 17 slices shown (1 of 2)]
[im 1/17]
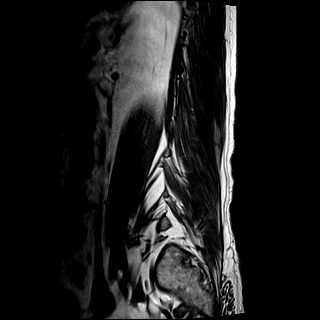
[im 3/17]
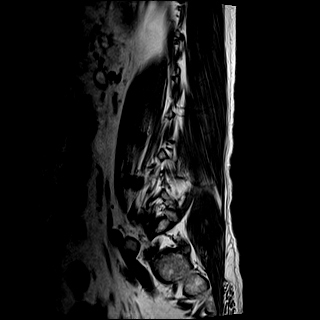
[im 6/17]
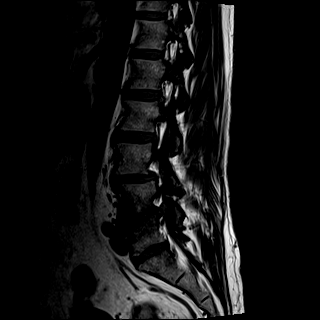
[im 9/17]
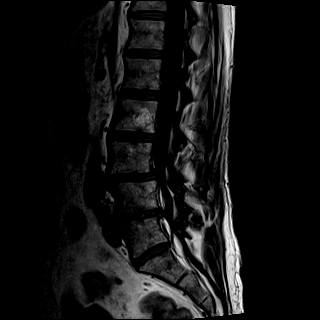
[im 11/17]
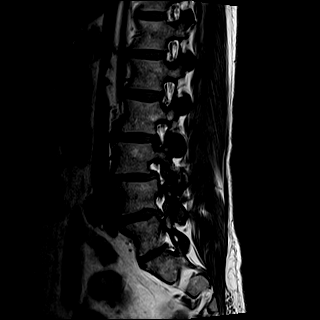
[im 14/17]
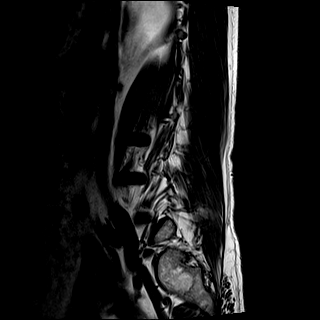
[im 17/17]
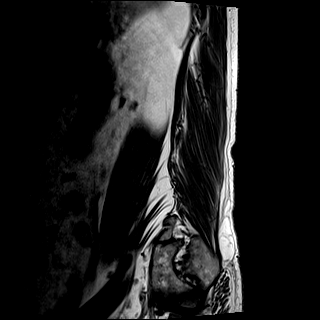

[Series 7: STIR · sagittal · 4.0mm · 0.41mm/px · 1 of 17 slices shown]
[im 1/17]
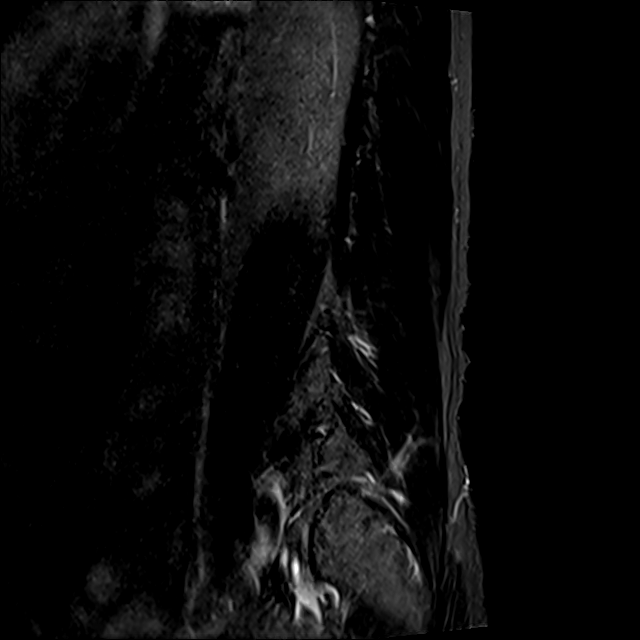

[Series 8: T2 · axial · 4.0mm · 0.78mm/px · z∈[-44,+164]mm · 8 of 36 slices shown (2 of 2)]
[im 1/36]
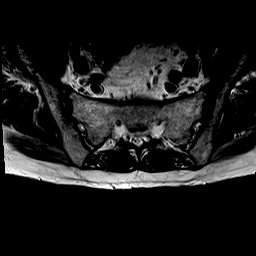
[im 6/36]
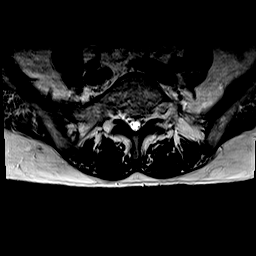
[im 11/36]
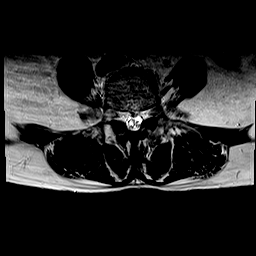
[im 17/36]
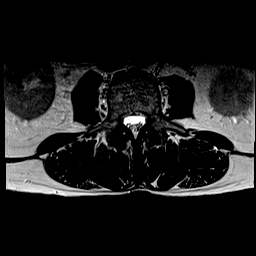
[im 19/36]
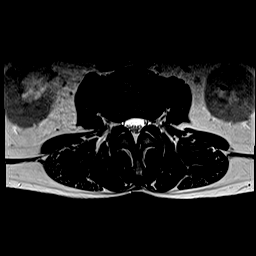
[im 25/36]
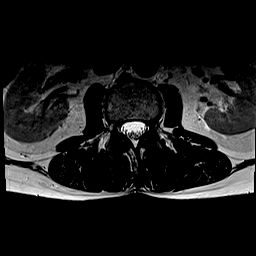
[im 30/36]
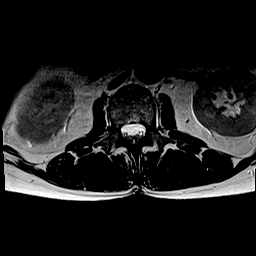
[im 36/36]
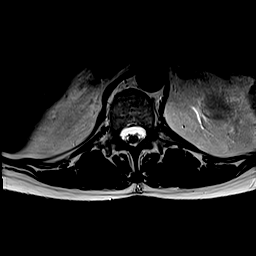

[Series 9: T1 · axial · 4.0mm · 0.39mm/px · z∈[-44,+164]mm · 8 of 36 slices shown (2 of 2)]
[im 1/36]
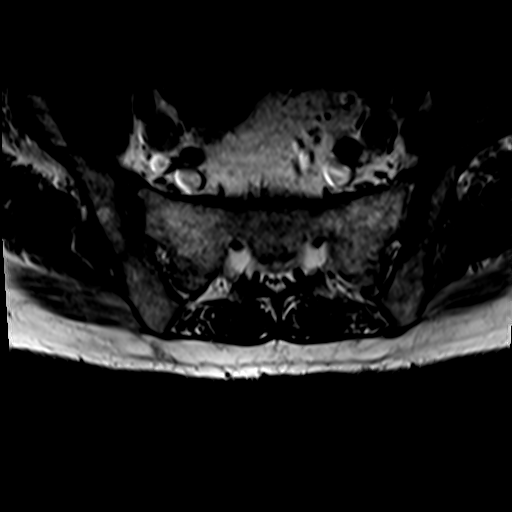
[im 6/36]
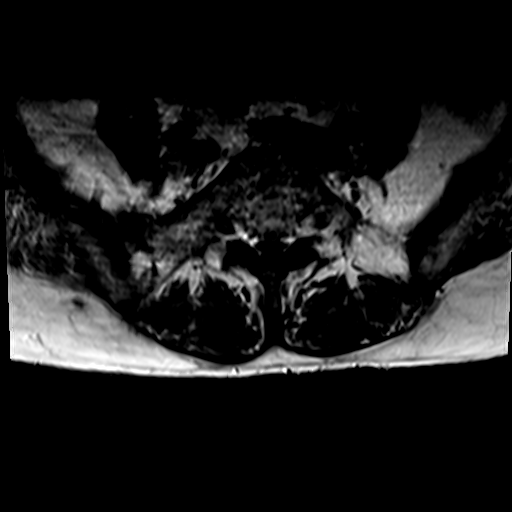
[im 11/36]
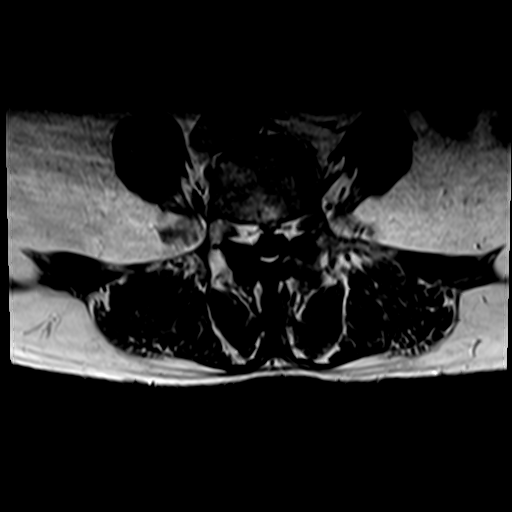
[im 17/36]
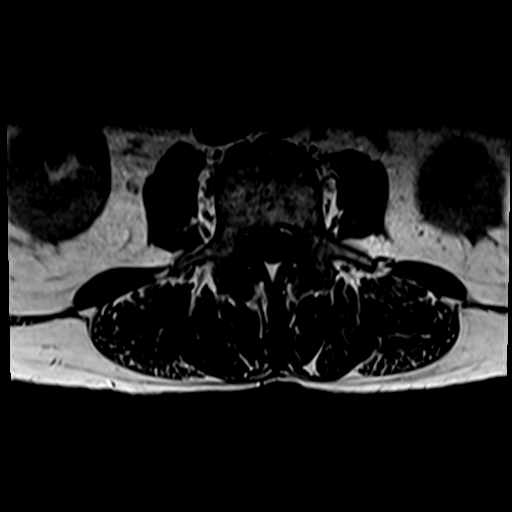
[im 19/36]
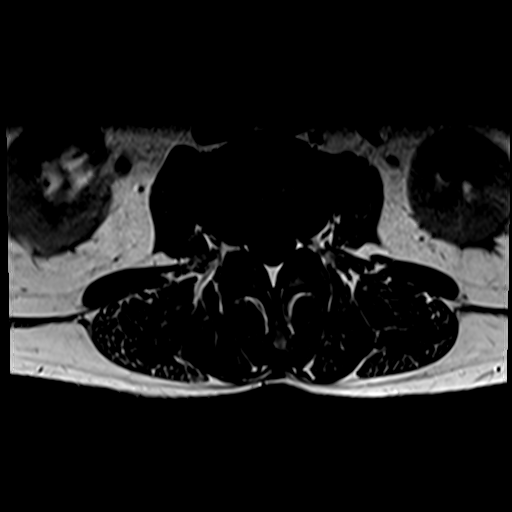
[im 25/36]
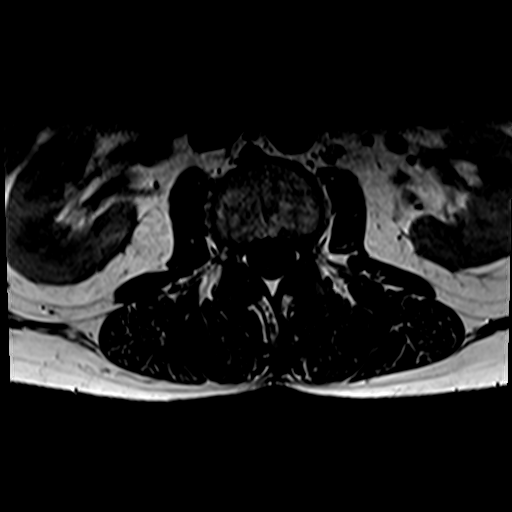
[im 30/36]
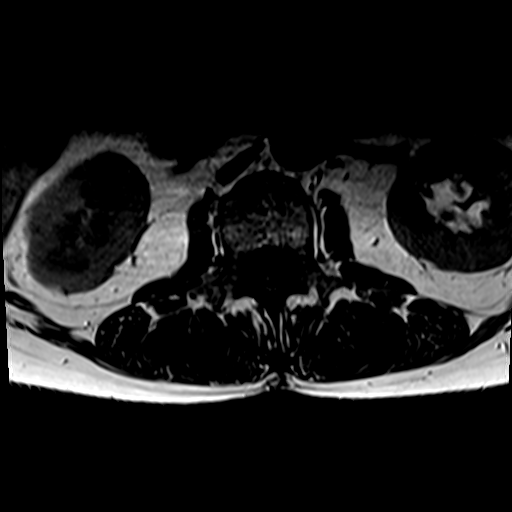
[im 36/36]
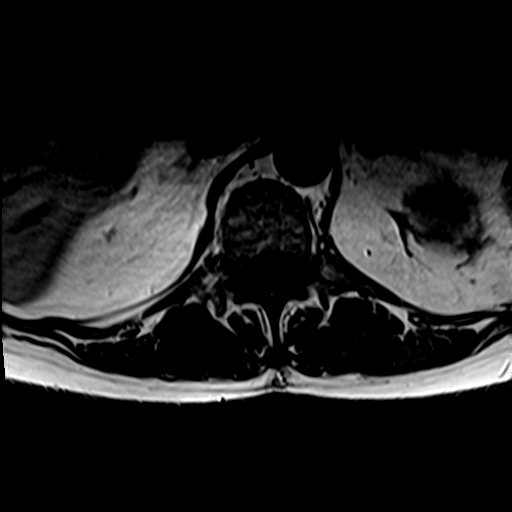

[30 of 48 positions shown; findings below may reference images not displayed]

FINDINGS: Segmentation:  Standard.

Alignment:  Grade 1 anterolisthesis at L3-L4.

Vertebrae: No acute fracture, discitis, or aggressive osseous
lesion. There is a vertebral body hemangioma of L2.

Conus medullaris and cauda equina: Conus extends to the L1 level.
Conus and cauda equina appear normal.

Paraspinal and other soft tissues: Negative

Disc levels:

T11-T12: Minimal disc bulging. No significant spinal canal or neural
foraminal stenosis.

T12-L1: No significant spinal canal or neural foraminal narrowing.

L1-L2: Minimal disc bulging. No significant spinal canal or neural
foraminal stenosis.

L2-L3: Right foraminal disc protrusion, ligamentum flavum
hypertrophy and bilateral facet arthropathy. Mild right lateral
recess and neural foraminal narrowing.

L3-L4: Grade 1 anterolisthesis with broad-based disc bulging,
ligamentum flavum hypertrophy and mild facet arthropathy results in
severe spinal canal stenosis, and moderate bilateral neural
foraminal narrowing. There is potential for impingement of the
descending and exiting nerve roots bilaterally.

L4-L5: Disc height loss with asymmetric left broad-based disc
bulging, ligamentum flavum hypertrophy and bilateral facet
arthropathy results in severe spinal canal stenosis, severe left and
mild right neural foraminal narrowing. Likely impingement of the
exiting left-sided nerve root and descending nerve roots.

L5-S1: No significant spinal canal or neural foraminal narrowing.
IMPRESSION: Multilevel degenerative changes of the lumbar spine, worst at L3-L4
and L4-L5.

Grade 1 anterolisthesis with disc bulging and facet degeneration at
L3-L4 resulting in severe spinal canal stenosis and moderate
bilateral neural foraminal narrowing, with potential for impingement
of descending and exiting nerve roots bilaterally.

Asymmetric left disc bulging and facet degeneration results in
severe spinal canal stenosis and severe left-sided neural foraminal
stenosis at L4-L5. Likely impingement of the left exiting nerve root
and descending nerve roots.

## 2023-01-28 ENCOUNTER — Encounter: Payer: Self-pay | Admitting: Oncology

## 2023-01-28 ENCOUNTER — Inpatient Hospital Stay: Payer: Medicaid Other | Admitting: Internal Medicine

## 2023-01-28 ENCOUNTER — Inpatient Hospital Stay: Payer: Medicaid Other | Attending: Oncology

## 2023-07-30 ENCOUNTER — Ambulatory Visit: Payer: Medicaid Other | Admitting: Family Medicine

## 2023-07-30 ENCOUNTER — Encounter: Payer: Self-pay | Admitting: Family Medicine

## 2023-07-30 VITALS — BP 118/74 | HR 78 | Temp 97.5°F | Resp 18 | Ht 62.0 in | Wt 134.8 lb

## 2023-07-30 DIAGNOSIS — J309 Allergic rhinitis, unspecified: Secondary | ICD-10-CM | POA: Insufficient documentation

## 2023-07-30 DIAGNOSIS — J3089 Other allergic rhinitis: Secondary | ICD-10-CM | POA: Diagnosis not present

## 2023-07-30 DIAGNOSIS — G4700 Insomnia, unspecified: Secondary | ICD-10-CM | POA: Insufficient documentation

## 2023-07-30 DIAGNOSIS — Z1231 Encounter for screening mammogram for malignant neoplasm of breast: Secondary | ICD-10-CM

## 2023-07-30 DIAGNOSIS — M48061 Spinal stenosis, lumbar region without neurogenic claudication: Secondary | ICD-10-CM

## 2023-07-30 DIAGNOSIS — E118 Type 2 diabetes mellitus with unspecified complications: Secondary | ICD-10-CM | POA: Diagnosis not present

## 2023-07-30 DIAGNOSIS — Z794 Long term (current) use of insulin: Secondary | ICD-10-CM

## 2023-07-30 DIAGNOSIS — F5101 Primary insomnia: Secondary | ICD-10-CM

## 2023-07-30 DIAGNOSIS — E538 Deficiency of other specified B group vitamins: Secondary | ICD-10-CM

## 2023-07-30 DIAGNOSIS — E039 Hypothyroidism, unspecified: Secondary | ICD-10-CM

## 2023-07-30 MED ORDER — HYDROCODONE-ACETAMINOPHEN 5-325 MG PO TABS: 1.0000 | ORAL_TABLET | Freq: Four times a day (QID) | ORAL | 0 refills | Status: DC | PRN

## 2023-07-30 MED ORDER — PEN NEEDLES 30G X 8 MM MISC: 1.0000 | Freq: Every day | 3 refills | Status: AC

## 2023-07-30 NOTE — Progress Notes (Signed)
   Established Patient Office Visit  Subjective   Patient ID: Ashley Delgado, female    DOB: Feb 12, 1961  Age: 62 y.o. MRN: [id number]  Chief Complaint  Patient presents with   Medical Management of Chronic Issues    HPI Has severe scoliosis and needs narcotic refill.  Takes narcotic in order to be able to work.  Mostly states she just needs 1 but occasionally has to take 2 a day.    ROS    Objective:     BP 118/74 (BP Location: Left Arm, Patient Position: Sitting, Cuff Size: Normal)   Pulse 78   Temp (!) 97.5 F (36.4 C) (Oral)   Resp 18   Ht 5' 2 (1.575 m)   Wt 134 lb 12.8 oz (61.1 kg)   SpO2 97%   BMI 24.66 kg/m    Physical Exam       No results found for any visits on 07/30/23.    The ASCVD Risk score (Arnett DK, et al., 2019) failed to calculate for the following reasons:   Cannot find a previous HDL lab   Cannot find a previous total cholesterol lab    Assessment & Plan:  Hypothyroidism (acquired)  Non-seasonal allergic rhinitis, unspecified trigger  Controlled type 2 diabetes mellitus with complication, with long-term current use of insulin (HCC) Assessment & Plan: She is on Toujeo 10 units daily and Jardiance  25 and metformin  1000 twice daily and Trulicity  0.75 mg weekly.  She has not been checking her sugars at home.  A1c today.  Foot check today.  Has no calluses or sores on feet.  Wear sensible shoes.  Orders: -     Pen Needles; 1 each by Does not apply route daily.  Dispense: 300 each; Refill: 3  Encounter for screening mammogram for malignant neoplasm of breast  Degenerative lumbar spinal stenosis Assessment & Plan: She takes hydrocodone  for her back pain.  She usually takes them on the days that she works.  If she does a long shift she may not have to take 2 but in general she takes 1 daily.  She does get adequate relief of pain with hydrocodone .   Primary insomnia Assessment & Plan: She has Remeron  to take at night to help with insomnia  but she does not always do that.  Her back interferes with sleeping.      Return in about 3 months (around 10/28/2023).    Zahid Carneiro K Ehsan Corvin, MD

## 2023-07-30 NOTE — Assessment & Plan Note (Addendum)
 She is on Toujeo 10 units daily and Jardiance 25 and metformin 1000 twice daily and Trulicity 0.75 mg weekly.  She has not been checking her sugars at home.  A1c today.  Foot check today.  Has no calluses or sores on feet.  Wear sensible shoes.

## 2023-07-30 NOTE — Assessment & Plan Note (Signed)
She takes hydrocodone for her back pain.  She usually takes them on the days that she works.  If she does a long shift she may not have to take 2 but in general she takes 1 daily.  She does get adequate relief of pain with hydrocodone.

## 2023-07-30 NOTE — Assessment & Plan Note (Signed)
She has Remeron to take at night to help with insomnia but she does not always do that.  Her back interferes with sleeping.

## 2023-08-05 LAB — TSH+FREE T4
Free T4: 1 ng/dL (ref 0.82–1.77)
TSH: 21.4 u[IU]/mL — ABNORMAL HIGH (ref 0.450–4.500)

## 2023-08-05 LAB — LIPID PANEL
Chol/HDL Ratio: 2.5 {ratio} (ref 0.0–4.4)
Cholesterol, Total: 131 mg/dL (ref 100–199)
HDL: 52 mg/dL (ref >39)
LDL Chol Calc (NIH): 61 mg/dL (ref 0–99)
Triglycerides: 93 mg/dL (ref 0–149)
VLDL Cholesterol Cal: 18 mg/dL (ref 5–40)

## 2023-08-05 LAB — COMPREHENSIVE METABOLIC PANEL
ALT: 21 [IU]/L (ref 0–32)
AST: 27 [IU]/L (ref 0–40)
Albumin: 4.3 g/dL (ref 3.9–4.9)
Alkaline Phosphatase: 47 [IU]/L (ref 44–121)
BUN/Creatinine Ratio: 22 (ref 12–28)
BUN: 16 mg/dL (ref 8–27)
Bilirubin Total: 0.3 mg/dL (ref 0.0–1.2)
CO2: 24 mmol/L (ref 20–29)
Calcium: 9.2 mg/dL (ref 8.7–10.3)
Chloride: 105 mmol/L (ref 96–106)
Creatinine, Ser: 0.74 mg/dL (ref 0.57–1.00)
Globulin, Total: 2 g/dL (ref 1.5–4.5)
Glucose: 126 mg/dL — ABNORMAL HIGH (ref 70–99)
Potassium: 4.2 mmol/L (ref 3.5–5.2)
Sodium: 144 mmol/L (ref 134–144)
Total Protein: 6.3 g/dL (ref 6.0–8.5)
eGFR: 91 mL/min/{1.73_m2} (ref >59)

## 2023-08-05 LAB — MICROALBUMIN / CREATININE URINE RATIO
Creatinine, Urine: 48.4 mg/dL
Microalb/Creat Ratio: 9 mg/g{creat} (ref 0–29)
Microalbumin, Urine: 4.2 ug/mL

## 2023-08-05 LAB — VITAMIN B12: Vitamin B-12: 470 pg/mL (ref 232–1245)

## 2023-08-05 LAB — HEMOGLOBIN A1C
Est. average glucose Bld gHb Est-mCnc: 137 mg/dL
Hgb A1c MFr Bld: 6.4 % — ABNORMAL HIGH (ref 4.8–5.6)

## 2023-08-30 ENCOUNTER — Other Ambulatory Visit: Payer: Self-pay | Admitting: Family Medicine

## 2023-09-06 ENCOUNTER — Other Ambulatory Visit: Payer: Self-pay | Admitting: Family Medicine

## 2023-09-06 MED ORDER — HYDROCODONE-ACETAMINOPHEN 5-325 MG PO TABS: 1.0000 | ORAL_TABLET | Freq: Three times a day (TID) | ORAL | 0 refills | Status: DC | PRN

## 2023-09-06 NOTE — Progress Notes (Signed)
 Last filled 12/24.  Has scoliosis

## 2023-09-06 NOTE — Telephone Encounter (Signed)
 Copied from CRM [id number]. Topic: Clinical - Medication Refill >> Sep 06, 2023  1:23 PM Wess RAMAN wrote: Most Recent Primary Care Visit:   Medication:  HYDROcodone -acetaminophen  (NORCO/VICODIN) 5-325 MG tablet  Has the patient contacted their pharmacy? Yes (Agent: If no, request that the patient contact the pharmacy for the refill. If patient does not wish to contact the pharmacy document the reason why and proceed with request.) (Agent: If yes, when and what did the pharmacy advise?)  Is this the correct pharmacy for this prescription? Yes If no, delete pharmacy and type the correct one.  This is the patient's preferred pharmacy:  [facility] [place], [place] - [address] [place] [address] Phone: [phone] Fax: [phone]   Has the prescription been filled recently? No  Is the patient out of the medication? Yes  Has the patient been seen for an appointment in the last year OR does the patient have an upcoming appointment? Yes  Can we respond through MyChart? No  Agent: Please be advised that Rx refills may take up to 3 business days. We ask that you follow-up with your pharmacy.

## 2023-09-30 LAB — HM DIABETES EYE EXAM

## 2023-10-14 ENCOUNTER — Other Ambulatory Visit: Payer: Self-pay | Admitting: Family Medicine

## 2023-10-14 NOTE — Telephone Encounter (Signed)
 Copied from CRM [id number]. Topic: Clinical - Medication Refill >> Oct 14, 2023  9:18 AM Desma Mcgregor wrote: Most Recent Primary Care Visit:  Provider: Alease Medina  Department: [facility]  Visit Type: NEW PATIENT  Date: 07/30/2023  Medication: metFORMIN (GLUCOPHAGE) 1000 MG tablet HYDROcodone-acetaminophen (NORCO/VICODIN) 5-325 MG tablet   Has the patient contacted their pharmacy? Yes, says there are no more refills  Is this the correct pharmacy for this prescription? Yes If no, delete pharmacy and type the correct one.  This is the patient's preferred pharmacy:  [facility] - [place], [place] - [address] [place] [address] Phone: [phone] Fax: [phone]  Has the prescription been filled recently? Yes  Is the patient out of the medication? Yes, out since Friday for Metformin, Has 3 left of the Hydrocodone  Has the patient been seen for an appointment in the last year OR does the patient have an upcoming appointment? Yes  Can we respond through MyChart? No  Agent: Please be advised that Rx refills may take up to 3 business days. We ask that you follow-up with your pharmacy.

## 2023-10-15 ENCOUNTER — Other Ambulatory Visit: Payer: Self-pay | Admitting: Family Medicine

## 2023-10-15 DIAGNOSIS — E118 Type 2 diabetes mellitus with unspecified complications: Secondary | ICD-10-CM

## 2023-10-15 MED ORDER — METFORMIN HCL 1000 MG PO TABS: 1000.0000 mg | ORAL_TABLET | Freq: Two times a day (BID) | ORAL | 3 refills | Status: DC

## 2023-10-15 MED ORDER — HYDROCODONE-ACETAMINOPHEN 5-325 MG PO TABS: 1.0000 | ORAL_TABLET | Freq: Four times a day (QID) | ORAL | 0 refills | Status: DC | PRN

## 2023-10-15 NOTE — Telephone Encounter (Unsigned)
 Copied from CRM [id number]. Topic: Clinical - Medication Refill >> Oct 15, 2023 10:58 AM Shelah Lewandowsky wrote: Most Recent Primary Care Visit:  Provider: Alease Medina  Department: [facility]  Visit Type: NEW PATIENT  Date: 07/30/2023  Medication: HYDROcodone-acetaminophen (NORCO/VICODIN) 5-325 MG tablet metFORMIN (GLUCOPHAGE) 1000 MG tablet  Has the patient contacted their pharmacy? Yes (Agent: If no, request that the patient contact the pharmacy for the refill. If patient does not wish to contact the pharmacy document the reason why and proceed with request.) (Agent: If yes, when and what did the pharmacy advise?)  Is this the correct pharmacy for this prescription? Yes If no, delete pharmacy and type the correct one.  This is the patient's preferred pharmacy:  [facility] - [place], [place] - [address] [place] [address] Phone: [phone] Fax: [phone]   Has the prescription been filled recently? Yes  Is the patient out of the medication? Yes  Has the patient been seen for an appointment in the last year OR does the patient have an upcoming appointment? Yes  Can we respond through MyChart? No  Agent: Please be advised that Rx refills may take up to 3 business days. We ask that you follow-up with your pharmacy.

## 2023-10-17 NOTE — Telephone Encounter (Signed)
 Already refilled by different means

## 2023-10-28 ENCOUNTER — Ambulatory Visit: Payer: Medicaid Other | Admitting: Family Medicine

## 2023-10-28 ENCOUNTER — Encounter: Payer: Self-pay | Admitting: Family Medicine

## 2023-10-28 VITALS — BP 118/73 | HR 72 | Temp 97.5°F | Resp 18 | Ht 62.0 in | Wt 130.0 lb

## 2023-10-28 DIAGNOSIS — B009 Herpesviral infection, unspecified: Secondary | ICD-10-CM

## 2023-10-28 DIAGNOSIS — T7840XS Allergy, unspecified, sequela: Secondary | ICD-10-CM

## 2023-10-28 DIAGNOSIS — E118 Type 2 diabetes mellitus with unspecified complications: Secondary | ICD-10-CM | POA: Diagnosis not present

## 2023-10-28 DIAGNOSIS — I10 Essential (primary) hypertension: Secondary | ICD-10-CM

## 2023-10-28 DIAGNOSIS — M4185 Other forms of scoliosis, thoracolumbar region: Secondary | ICD-10-CM

## 2023-10-28 DIAGNOSIS — K219 Gastro-esophageal reflux disease without esophagitis: Secondary | ICD-10-CM

## 2023-10-28 DIAGNOSIS — M179 Osteoarthritis of knee, unspecified: Secondary | ICD-10-CM | POA: Insufficient documentation

## 2023-10-28 DIAGNOSIS — M1712 Unilateral primary osteoarthritis, left knee: Secondary | ICD-10-CM

## 2023-10-28 DIAGNOSIS — E039 Hypothyroidism, unspecified: Secondary | ICD-10-CM

## 2023-10-28 DIAGNOSIS — F5101 Primary insomnia: Secondary | ICD-10-CM

## 2023-10-28 DIAGNOSIS — G4762 Sleep related leg cramps: Secondary | ICD-10-CM

## 2023-10-28 DIAGNOSIS — L659 Nonscarring hair loss, unspecified: Secondary | ICD-10-CM | POA: Diagnosis not present

## 2023-10-28 DIAGNOSIS — M48061 Spinal stenosis, lumbar region without neurogenic claudication: Secondary | ICD-10-CM

## 2023-10-28 DIAGNOSIS — E782 Mixed hyperlipidemia: Secondary | ICD-10-CM | POA: Insufficient documentation

## 2023-10-28 LAB — POCT GLYCOSYLATED HEMOGLOBIN (HGB A1C): Hemoglobin A1C: 6.2 % — AB (ref 4.0–5.6)

## 2023-10-28 MED ORDER — MINOXIDIL 2.5 MG PO TABS: 2.5000 mg | ORAL_TABLET | Freq: Every day | ORAL | 1 refills | Status: AC

## 2023-10-28 MED ORDER — METHOCARBAMOL 500 MG PO TABS: 500.0000 mg | ORAL_TABLET | Freq: Three times a day (TID) | ORAL | 0 refills | Status: AC | PRN

## 2023-10-28 MED ORDER — PANTOPRAZOLE SODIUM 40 MG PO TBEC: 40.0000 mg | DELAYED_RELEASE_TABLET | Freq: Every day | ORAL | 1 refills | Status: AC

## 2023-10-28 MED ORDER — LISINOPRIL 40 MG PO TABS: 40.0000 mg | ORAL_TABLET | Freq: Every day | ORAL | 1 refills | Status: DC

## 2023-10-28 MED ORDER — TRULICITY 4.5 MG/0.5ML ~~LOC~~ SOAJ: 4.5000 mg | SUBCUTANEOUS | 3 refills | Status: DC

## 2023-10-28 MED ORDER — METFORMIN HCL 1000 MG PO TABS
1000.0000 mg | ORAL_TABLET | Freq: Two times a day (BID) | ORAL | 3 refills | Status: AC
Start: 2023-10-28 — End: ?

## 2023-10-28 MED ORDER — HYDROCHLOROTHIAZIDE 25 MG PO TABS: 25.0000 mg | ORAL_TABLET | Freq: Every day | ORAL | 1 refills | Status: DC

## 2023-10-28 MED ORDER — HYDROCODONE-ACETAMINOPHEN 5-325 MG PO TABS: 1.0000 | ORAL_TABLET | Freq: Four times a day (QID) | ORAL | 0 refills | Status: DC | PRN

## 2023-10-28 MED ORDER — MIRTAZAPINE 30 MG PO TABS
30.0000 mg | ORAL_TABLET | ORAL | 1 refills | Status: AC | PRN
Start: 2023-10-28 — End: ?

## 2023-10-28 MED ORDER — MONTELUKAST SODIUM 10 MG PO TABS: 10.0000 mg | ORAL_TABLET | Freq: Every day | ORAL | 1 refills | Status: AC

## 2023-10-28 MED ORDER — LEVOTHYROXINE SODIUM 112 MCG PO TABS: 112.0000 ug | ORAL_TABLET | Freq: Every day | ORAL | 1 refills | Status: DC

## 2023-10-28 MED ORDER — GABAPENTIN 300 MG PO CAPS: 300.0000 mg | ORAL_CAPSULE | Freq: Three times a day (TID) | ORAL | 1 refills | Status: DC

## 2023-10-28 MED ORDER — MELOXICAM 15 MG PO TABS: 15.0000 mg | ORAL_TABLET | Freq: Every day | ORAL | 1 refills | Status: AC

## 2023-10-28 MED ORDER — ACYCLOVIR 400 MG PO TABS: 400.0000 mg | ORAL_TABLET | Freq: Every day | ORAL | 1 refills | Status: AC

## 2023-10-28 MED ORDER — JARDIANCE 25 MG PO TABS
25.0000 mg | ORAL_TABLET | Freq: Every day | ORAL | 1 refills | Status: DC
Start: 2023-10-28 — End: 2024-06-11

## 2023-10-28 MED ORDER — ROSUVASTATIN CALCIUM 10 MG PO TABS: 10.0000 mg | ORAL_TABLET | Freq: Every day | ORAL | 1 refills | Status: DC

## 2023-10-28 MED ORDER — HYDROCODONE-ACETAMINOPHEN 5-325 MG PO TABS
1.0000 | ORAL_TABLET | Freq: Four times a day (QID) | ORAL | 0 refills | Status: DC | PRN
Start: 2023-10-28 — End: 2024-01-30

## 2023-10-28 NOTE — Assessment & Plan Note (Addendum)
 Her back is better..  When her back is bothering her she gets bilateral peripheral neuropathy down to the calf.  Refilled hydrocodone acetaminophen 5/325 #60/month.  6-month supply given.

## 2023-10-28 NOTE — Assessment & Plan Note (Signed)
 Has levothyroxine 112 mcg.  Was taking this Monday through Friday only and TSH became 21.400.  Now taking levothyroxine 112 mcg daily.  Will check TSH and free T4

## 2023-10-28 NOTE — Assessment & Plan Note (Signed)
 Gets nocturnal leg cramps sometimes in her anterior legs.  She has been using pickle juice and this seems to work for her.

## 2023-10-28 NOTE — Assessment & Plan Note (Signed)
 A1c today is 6.2%.  Has seen eye doctor this month.  Stop Toujeo 10 units today and continue Jardiance 25, metformin 1000/1000 and Trulicity 4.5 mg weekly.  Follow-up every 3 months.

## 2023-10-28 NOTE — Progress Notes (Signed)
 Established Patient Office Visit  Subjective   Patient ID: Ashley Delgado, female    DOB: 29-Mar-1961  Age: 63 y.o. 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 She went to the eye doctor this month and will send the report back to [place].  Her left knee hurts she has been using Salonpas over the medial compartment and she does get some relief of pain. On last labs TSH 21.400 she is taking her levothyroxine 112 mcg every day the week now instead of just Monday through Friday.  Reports no fatigue, constipation or peripheral edema. She is on Toujeo 10 units a day, Jardiance 25, metformin 1000 twice daily and Trulicity 4.5mg  weekly.  Denies any episodes of low blood sugar or headache, confusion, dizziness, diaphoresis.    ROS    Objective:     BP 118/73 (BP Location: Left Arm, Patient Position: Sitting, Cuff Size: Normal)   Pulse 72   Temp (!) 97.5 F (36.4 C) (Oral)   Resp 18   Ht 5\' 2"  (1.575 m)   Wt 130 lb (59 kg)   SpO2 97%   BMI 23.78 kg/m    Physical Exam Vitals and nursing note reviewed.  Constitutional:      Appearance: Normal appearance.  HENT:     Head: Normocephalic and atraumatic.  Eyes:     Conjunctiva/sclera: Conjunctivae normal.  Cardiovascular:     Rate and Rhythm: Normal rate and regular rhythm.  Pulmonary:     Effort: Pulmonary effort is normal.     Breath sounds: Normal breath sounds.  Musculoskeletal:     Right lower leg: No edema.     Left lower leg: No edema.  Skin:    General: Skin is warm and dry.  Neurological:     Mental Status: She is alert and oriented to person, place, and time.     Comments: Tenderness over the meniscus of medial compartment of the left knee.  Minimal effusion and warmth.  Psychiatric:        Mood and Affect: Mood  normal.        Behavior: Behavior normal.        Thought Content: Thought content normal.        Judgment: Judgment normal.          Results for orders placed or performed in visit on 10/28/23  POCT glycosylated hemoglobin (Hb A1C)  Result Value Ref Range   Hemoglobin A1C 6.2 (A) 4.0 - 5.6 %   HbA1c POC (<> result, manual entry)     HbA1c, POC (prediabetic range)     HbA1c, POC (controlled diabetic range)        The 10-year ASCVD risk score (Arnett DK, et al., 2019) is: 8.7%    Assessment & Plan:  Hair loss -     Minoxidil; Take 1 tablet (2.5 mg total) by mouth daily.  Dispense: 90 tablet; Refill: 1  Controlled type 2 diabetes mellitus with complication, without long-term current use of insulin (HCC) Assessment & Plan: A1c today is 6.2%.  Has seen eye doctor this month.  Stop Toujeo 10 units today and continue Jardiance 25, metformin 1000/1000 and Trulicity 4.5 mg weekly.  Follow-up every 3 months.  Orders: -     Jardiance; Take 1 tablet (25 mg total) by mouth daily.  Dispense: 90 tablet; Refill: 1 -  metFORMIN HCl; Take 1 tablet (1,000 mg total) by mouth 2 (two) times daily.  Dispense: 180 tablet; Refill: 3 -     Trulicity; Inject 4.5 mg into the skin once a week.  Dispense: 2 mL; Refill: 3 -     POCT glycosylated hemoglobin (Hb A1C)  Primary hypertension Assessment & Plan: Blood pressure is well-controlled on HCTZ 25 mg 40 mg.  We will check CMP and CBC  Orders: -     hydroCHLOROthiazide; Take 1 tablet (25 mg total) by mouth daily.  Dispense: 90 tablet; Refill: 1 -     Lisinopril; Take 1 tablet (40 mg total) by mouth daily.  Dispense: 90 tablet; Refill: 1 -     CBC with Differential/Platelet -     Comprehensive metabolic panel with GFR  HSV (herpes simplex virus) infection -     Acyclovir; Take 1 tablet (400 mg total) by mouth daily.  Dispense: 90 tablet; Refill: 1  Other form of scoliosis of thoracolumbar spine -     Gabapentin; Take 1 capsule (300 mg total)  by mouth 3 (three) times daily.  Dispense: 270 capsule; Refill: 1 -     HYDROcodone-Acetaminophen; Take 1 tablet by mouth every 6 (six) hours as needed for moderate pain (pain score 4-6) (scoliosis).  Dispense: 60 tablet; Refill: 0 -     Meloxicam; Take 1 tablet (15 mg total) by mouth daily.  Dispense: 90 tablet; Refill: 1 -     Methocarbamol; Take 1 tablet (500 mg total) by mouth every 8 (eight) hours as needed for muscle spasms.  Dispense: 20 tablet; Refill: 0 -     HYDROcodone-Acetaminophen; Take 1 tablet by mouth every 6 (six) hours as needed for moderate pain (pain score 4-6) (scoliosis).  Dispense: 60 tablet; Refill: 0 -     HYDROcodone-Acetaminophen; Take 1 tablet by mouth every 6 (six) hours as needed for moderate pain (pain score 4-6) (scoliosis).  Dispense: 60 tablet; Refill: 0  Acquired hypothyroidism -     Levothyroxine Sodium; Take 1 tablet (112 mcg total) by mouth daily.  Dispense: 90 tablet; Refill: 1 -     TSH + free T4  Primary insomnia -     Mirtazapine; Take 1 tablet (30 mg total) by mouth as needed.  Dispense: 90 tablet; Refill: 1  Allergy, sequela -     Montelukast Sodium; Take 1 tablet (10 mg total) by mouth at bedtime.  Dispense: 90 tablet; Refill: 1  Gastroesophageal reflux disease, unspecified whether esophagitis present -     Pantoprazole Sodium; Take 1 tablet (40 mg total) by mouth daily.  Dispense: 90 tablet; Refill: 1  Mixed hyperlipidemia Assessment & Plan: As she has diabetes goal is LDL 70 or less.  She takes rosuvastatin 10 mg daily.  Orders: -     Rosuvastatin Calcium; Take 1 tablet (10 mg total) by mouth at bedtime.  Dispense: 90 tablet; Refill: 1 -     Lipid panel  Degenerative lumbar spinal stenosis Assessment & Plan: Her back is better..  When her back is bothering her she gets bilateral peripheral neuropathy down to the calf.  Refilled hydrocodone acetaminophen 5/325 #60/month.  1-month supply given.   Nocturnal leg cramps Assessment &  Plan: Gets nocturnal leg cramps sometimes in her anterior legs.  She has been using pickle juice and this seems to work for her.   Primary osteoarthritis of left knee Assessment & Plan: Has pain in the medial compartment of the knee.  He is using [employer]  and this helps.  Would like a referral to Ortho to see if the knee injection would help her.   Hypothyroidism (acquired) Assessment & Plan: Has levothyroxine 112 mcg.  Was taking this Monday through Friday only and TSH became 21.400.  Now taking levothyroxine 112 mcg daily.  Will check TSH and free T4      Return in about 3 months (around 01/27/2024) for A1c.    Alease Medina, MD

## 2023-10-28 NOTE — Assessment & Plan Note (Signed)
 Has pain in the medial compartment of the knee.  He is using Salonpas and this helps.  Would like a referral to Ortho to see if the knee injection would help her.

## 2023-10-28 NOTE — Assessment & Plan Note (Signed)
 As she has diabetes goal is LDL 70 or less.  She takes rosuvastatin 10 mg daily.

## 2023-10-28 NOTE — Assessment & Plan Note (Signed)
 Blood pressure is well-controlled on HCTZ 25 mg 40 mg.  We will check CMP and CBC

## 2023-10-29 ENCOUNTER — Encounter: Payer: Self-pay | Admitting: Family Medicine

## 2023-10-29 ENCOUNTER — Other Ambulatory Visit: Payer: Self-pay | Admitting: Family Medicine

## 2023-10-29 DIAGNOSIS — D751 Secondary polycythemia: Secondary | ICD-10-CM

## 2023-10-29 LAB — COMPREHENSIVE METABOLIC PANEL WITH GFR
ALT: 13 IU/L (ref 0–32)
AST: 18 IU/L (ref 0–40)
Albumin: 4.2 g/dL (ref 3.9–4.9)
Alkaline Phosphatase: 47 IU/L (ref 44–121)
BUN/Creatinine Ratio: 25 (ref 12–28)
BUN: 19 mg/dL (ref 8–27)
Bilirubin Total: 0.4 mg/dL (ref 0.0–1.2)
CO2: 23 mmol/L (ref 20–29)
Calcium: 9.2 mg/dL (ref 8.7–10.3)
Chloride: 106 mmol/L (ref 96–106)
Creatinine, Ser: 0.75 mg/dL (ref 0.57–1.00)
Globulin, Total: 2.2 g/dL (ref 1.5–4.5)
Glucose: 119 mg/dL — ABNORMAL HIGH (ref 70–99)
Potassium: 4.4 mmol/L (ref 3.5–5.2)
Sodium: 143 mmol/L (ref 134–144)
Total Protein: 6.4 g/dL (ref 6.0–8.5)
eGFR: 89 mL/min/{1.73_m2} (ref >59)

## 2023-10-29 LAB — CBC WITH DIFFERENTIAL/PLATELET
Basophils Absolute: 0.1 10*3/uL (ref 0.0–0.2)
Basos: 1 %
EOS (ABSOLUTE): 0.2 10*3/uL (ref 0.0–0.4)
Eos: 3 %
Hematocrit: 51.1 % — ABNORMAL HIGH (ref 34.0–46.6)
Hemoglobin: 16.8 g/dL — ABNORMAL HIGH (ref 11.1–15.9)
Immature Grans (Abs): 0 10*3/uL (ref 0.0–0.1)
Immature Granulocytes: 0 %
Lymphocytes Absolute: 2.7 10*3/uL (ref 0.7–3.1)
Lymphs: 34 %
MCH: 30.4 pg (ref 26.6–33.0)
MCHC: 32.9 g/dL (ref 31.5–35.7)
MCV: 93 fL (ref 79–97)
Monocytes Absolute: 0.5 10*3/uL (ref 0.1–0.9)
Monocytes: 7 %
Neutrophils Absolute: 4.3 10*3/uL (ref 1.4–7.0)
Neutrophils: 55 %
Platelets: 218 10*3/uL (ref 150–450)
RBC: 5.52 x10E6/uL — ABNORMAL HIGH (ref 3.77–5.28)
RDW: 12.7 % (ref 11.7–15.4)
WBC: 7.7 10*3/uL (ref 3.4–10.8)

## 2023-10-29 LAB — LIPID PANEL
Chol/HDL Ratio: 2.8 ratio (ref 0.0–4.4)
Cholesterol, Total: 130 mg/dL (ref 100–199)
HDL: 47 mg/dL (ref >39)
LDL Chol Calc (NIH): 58 mg/dL (ref 0–99)
Triglycerides: 148 mg/dL (ref 0–149)
VLDL Cholesterol Cal: 25 mg/dL (ref 5–40)

## 2023-10-29 LAB — TSH+FREE T4
Free T4: 2.04 ng/dL — ABNORMAL HIGH (ref 0.82–1.77)
TSH: 0.245 u[IU]/mL — ABNORMAL LOW (ref 0.450–4.500)

## 2023-11-11 ENCOUNTER — Telehealth: Payer: Self-pay | Admitting: Family Medicine

## 2023-11-11 ENCOUNTER — Other Ambulatory Visit: Payer: Self-pay | Admitting: Family Medicine

## 2023-11-11 DIAGNOSIS — G8929 Other chronic pain: Secondary | ICD-10-CM

## 2023-11-11 NOTE — Telephone Encounter (Signed)
 Copied from CRM [id number]. Topic: Referral - Status >> Nov 08, 2023 12:17 PM Tiffany S wrote: Reason for CRM: Patient called about status of referral to orthopedic patient stated she hasn't received a call please follow up  Lt knee injection    I do not see an orthopedic referral placed for this patient.

## 2023-11-12 ENCOUNTER — Other Ambulatory Visit: Payer: Self-pay

## 2023-11-12 DIAGNOSIS — D751 Secondary polycythemia: Secondary | ICD-10-CM

## 2023-11-13 ENCOUNTER — Inpatient Hospital Stay: Attending: Oncology

## 2023-11-13 DIAGNOSIS — D751 Secondary polycythemia: Secondary | ICD-10-CM | POA: Diagnosis present

## 2023-11-13 DIAGNOSIS — Z79624 Long term (current) use of inhibitors of nucleotide synthesis: Secondary | ICD-10-CM | POA: Insufficient documentation

## 2023-11-13 DIAGNOSIS — Z7982 Long term (current) use of aspirin: Secondary | ICD-10-CM | POA: Diagnosis not present

## 2023-11-13 DIAGNOSIS — Z87891 Personal history of nicotine dependence: Secondary | ICD-10-CM | POA: Insufficient documentation

## 2023-11-13 DIAGNOSIS — Z79899 Other long term (current) drug therapy: Secondary | ICD-10-CM | POA: Diagnosis not present

## 2023-11-13 LAB — CBC WITH DIFFERENTIAL (CANCER CENTER ONLY)
Abs Immature Granulocytes: 0.04 10*3/uL (ref 0.00–0.07)
Basophils Absolute: 0 10*3/uL (ref 0.0–0.1)
Basophils Relative: 1 %
Eosinophils Absolute: 0.2 10*3/uL (ref 0.0–0.5)
Eosinophils Relative: 2 %
HCT: 50.7 % — ABNORMAL HIGH (ref 36.0–46.0)
Hemoglobin: 16.4 g/dL — ABNORMAL HIGH (ref 12.0–15.0)
Immature Granulocytes: 1 %
Lymphocytes Relative: 33 %
Lymphs Abs: 2.4 10*3/uL (ref 0.7–4.0)
MCH: 29.8 pg (ref 26.0–34.0)
MCHC: 32.3 g/dL (ref 30.0–36.0)
MCV: 92.2 fL (ref 80.0–100.0)
Monocytes Absolute: 0.5 10*3/uL (ref 0.1–1.0)
Monocytes Relative: 7 %
Neutro Abs: 4.2 10*3/uL (ref 1.7–7.7)
Neutrophils Relative %: 56 %
Platelet Count: 212 10*3/uL (ref 150–400)
RBC: 5.5 MIL/uL — ABNORMAL HIGH (ref 3.87–5.11)
RDW: 13.1 % (ref 11.5–15.5)
WBC Count: 7.4 10*3/uL (ref 4.0–10.5)
nRBC: 0 % (ref 0.0–0.2)

## 2023-11-14 ENCOUNTER — Other Ambulatory Visit: Payer: Self-pay

## 2023-11-14 DIAGNOSIS — D751 Secondary polycythemia: Secondary | ICD-10-CM

## 2023-11-15 ENCOUNTER — Encounter: Payer: Self-pay | Admitting: Oncology

## 2023-11-15 ENCOUNTER — Inpatient Hospital Stay: Admitting: Oncology

## 2023-11-15 VITALS — BP 108/58 | HR 97 | Temp 98.0°F | Resp 19 | Ht 62.0 in | Wt 134.7 lb

## 2023-11-15 DIAGNOSIS — D751 Secondary polycythemia: Secondary | ICD-10-CM

## 2023-11-15 NOTE — Progress Notes (Signed)
 Hematology/Oncology Consult note [facility]  [phone] [phone]  Patient Care Team: Ziglar, Susan K, MD as PCP - General (Family Medicine)   Name of the patient: Ashley Delgado  [id number]  [birth year]   Date of visit: 11/15/23  Diagnosis- secondary polycythemia of unclear etiology   Chief complaint/ Reason for visit- routine f/u of polycythemia  Heme/Onc history: patient is a 63 year old female with a past medical history significant for hypothyroidism, hypertension type 2 diabetes who has been referred for polycythemia.  Most recent CBC from 05/20/2022 showed a white count of 9.2, H&H of 15.3/47.6 and a platelet count of 202.  CMP was within normal limits.  She was noted to have an H&H of 17.8/51.5 on 05/22/2022.  White count and platelets were normal.   Patient is a lifelong non-smoker.  She reports getting a restful sleep and does not snore.  She does however wake up a few times at night mainly to use the restroom as she is on water pills as well as drinks water at night.  Denies any daytime drowsiness or fatigue.  No known baseline lung disease.   Results of blood work from 06/15/2022 showed an H&H of 16.7/50.7 with a normal white count and platelet count.  JAK2 mutation was negative.  EPO levels were normal at 14.7.  Urinalysis did not show any hematuria.  Interval history-patient is doing well presently and denies any complaints at this time.  ECOG PS- 0 Pain scale- 0   Review of systems- Review of Systems  Constitutional:  Negative for chills, fever, malaise/fatigue and weight loss.  HENT:  Negative for congestion, ear discharge and nosebleeds.   Eyes:  Negative for blurred vision.  Respiratory:  Negative for cough, hemoptysis, sputum production, shortness of breath and wheezing.   Cardiovascular:  Negative for chest pain, palpitations, orthopnea and claudication.  Gastrointestinal:  Negative for abdominal pain, blood in  stool, constipation, diarrhea, heartburn, melena, nausea and vomiting.  Genitourinary:  Negative for dysuria, flank pain, frequency, hematuria and urgency.  Musculoskeletal:  Negative for back pain, joint pain and myalgias.  Skin:  Negative for rash.  Neurological:  Negative for dizziness, tingling, focal weakness, seizures, weakness and headaches.  Endo/Heme/Allergies:  Does not bruise/bleed easily.  Psychiatric/Behavioral:  Negative for depression and suicidal ideas. The patient does not have insomnia.       No Known Allergies   Past Medical History:  Diagnosis Date   Allergic rhinitis    FROM [facility] RECORDS   Aneurysm of unspecified site [facility])    Arterial aneurysm (HCC)    FROM [facility] RECORDS   Essential hypertension    Herpesviral infection    FROM [facility] RECORDS   Herpetic vulvovaginitis    FROM [facility] RECORDS   Hypothyroidism    FROM [facility] RECORDS   Mixed hypercholesterolemia and hypertriglyceridemia    FROM [facility] RECORDS   Pain in left knee    FROM [facility] RECORDS   Pure hypercholesterolemia, unspecified    FROM [facility] RECORDS   Type 2 diabetes mellitus with diabetic neuropathy (HCC)    FROM [facility] RECORDS   Type 2 diabetes mellitus without complication (HCC)    FROM [facility] RECORDS   Urticaria, unspecified    FROM [facility] RECORDS   Viral infection    FROM [facility] RECORDS     History reviewed. No pertinent surgical history.  Social History   Socioeconomic History   Marital status: Unknown    Spouse name: Not on file  Number of children: Not on file   Years of education: Not on file   Highest education level: Not on file  Occupational History   Not on file  Tobacco Use   Smoking status: Former    Current packs/day: 1.00    Types: Cigarettes    Passive exposure: Past   Smokeless tobacco: Never  Substance and Sexual Activity   Alcohol use: Yes    Comment: occasional drinking   Drug use: Not on file   Sexual activity: Yes  Other Topics Concern   Not on file  Social  History Narrative   Not on file   Social Drivers of Health   Financial Resource Strain: Not on file  Food Insecurity: Not on file  Transportation Needs: Not on file  Physical Activity: Not on file  Stress: Not on file  Social Connections: Not on file  Intimate Partner Violence: Not on file    Family History  Problem Relation Age of Onset   Heart attack Mother    Diabetes Mother    Hypertension Mother    Heart failure Father    Stroke Father    Diabetes Father    Hypertension Father    Hypertension Brother    Stroke Brother    Heart attack Brother    Leukemia Daughter    Thyroid cancer Daughter      Current Outpatient Medications:    acyclovir  (ZOVIRAX ) 400 MG tablet, Take 1 tablet (400 mg total) by mouth daily., Disp: 90 tablet, Rfl: 1   aspirin EC 81 MG tablet, Take 81 mg by mouth daily. Swallow whole., Disp: , Rfl:    gabapentin  (NEURONTIN ) 300 MG capsule, Take 1 capsule (300 mg total) by mouth 3 (three) times daily., Disp: 270 capsule, Rfl: 1   hydrochlorothiazide  (HYDRODIURIL ) 25 MG tablet, Take 1 tablet (25 mg total) by mouth daily., Disp: 90 tablet, Rfl: 1   HYDROcodone -acetaminophen  (NORCO/VICODIN) 5-325 MG tablet, Take 1 tablet by mouth every 6 (six) hours as needed for moderate pain (pain score 4-6) (scoliosis)., Disp: 60 tablet, Rfl: 0   [START ON 11/27/2023] HYDROcodone -acetaminophen  (NORCO/VICODIN) 5-325 MG tablet, Take 1 tablet by mouth every 6 (six) hours as needed for moderate pain (pain score 4-6) (scoliosis)., Disp: 60 tablet, Rfl: 0   [START ON 12/27/2023] HYDROcodone -acetaminophen  (NORCO/VICODIN) 5-325 MG tablet, Take 1 tablet by mouth every 6 (six) hours as needed for moderate pain (pain score 4-6) (scoliosis)., Disp: 60 tablet, Rfl: 0   Insulin Pen Needle (PEN NEEDLES) 30G X 8 MM MISC, 1 each by Does not apply route daily., Disp: 300 each, Rfl: 3   JARDIANCE  25 MG TABS tablet, Take 1 tablet (25 mg total) by mouth daily., Disp: 90 tablet, Rfl: 1    levothyroxine  (SYNTHROID ) 112 MCG tablet, Take 1 tablet (112 mcg total) by mouth daily., Disp: 90 tablet, Rfl: 1   lisinopril  (ZESTRIL ) 40 MG tablet, Take 1 tablet (40 mg total) by mouth daily., Disp: 90 tablet, Rfl: 1   meloxicam  (MOBIC ) 15 MG tablet, Take 1 tablet (15 mg total) by mouth daily., Disp: 90 tablet, Rfl: 1   metFORMIN  (GLUCOPHAGE ) 1000 MG tablet, Take 1 tablet (1,000 mg total) by mouth 2 (two) times daily., Disp: 180 tablet, Rfl: 3   methocarbamol  (ROBAXIN ) 500 MG tablet, Take 1 tablet (500 mg total) by mouth every 8 (eight) hours as needed for muscle spasms., Disp: 20 tablet, Rfl: 0   minoxidil  (LONITEN ) 2.5 MG tablet, Take 1 tablet (2.5 mg total) by mouth daily.,  Disp: 90 tablet, Rfl: 1   mirtazapine  (REMERON ) 30 MG tablet, Take 1 tablet (30 mg total) by mouth as needed., Disp: 90 tablet, Rfl: 1   montelukast  (SINGULAIR ) 10 MG tablet, Take 1 tablet (10 mg total) by mouth at bedtime., Disp: 90 tablet, Rfl: 1   pantoprazole  (PROTONIX ) 40 MG tablet, Take 1 tablet (40 mg total) by mouth daily., Disp: 90 tablet, Rfl: 1   rosuvastatin  (CRESTOR ) 10 MG tablet, Take 1 tablet (10 mg total) by mouth at bedtime., Disp: 90 tablet, Rfl: 1   TOUJEO SOLOSTAR 300 UNIT/ML Solostar Pen, Inject 10 Units into the skin at bedtime. (Patient not taking: Reported on 11/15/2023), Disp: , Rfl:    TRULICITY 4.5 MG/0.5ML SOAJ, Inject 4.5 mg into the skin once a week., Disp: 2 mL, Rfl: 3  Physical exam:  Vitals:   11/15/23 1353  BP: (!) 108/58  Pulse: 97  Resp: 19  Temp: 98 F (36.7 C)  TempSrc: Tympanic  SpO2: 100%  Weight: 134 lb 11.2 oz (61.1 kg)  Height: 5\' 2"  (1.575 m)   Physical Exam Cardiovascular:     Rate and Rhythm: Normal rate and regular rhythm.     Heart sounds: Normal heart sounds.  Pulmonary:     Effort: Pulmonary effort is normal.     Breath sounds: Normal breath sounds.  Skin:    General: Skin is warm and dry.  Neurological:     Mental Status: She is alert and oriented to  person, place, and time.      I have personally reviewed labs listed below:    Latest Ref Rng & Units 10/28/2023    9:11 AM  CMP  Glucose 70 - 99 mg/dL 578   BUN 8 - 27 mg/dL 19   Creatinine 4.69 - 1.00 mg/dL 6.29   Sodium 528 - 413 mmol/L 143   Potassium 3.5 - 5.2 mmol/L 4.4   Chloride 96 - 106 mmol/L 106   CO2 20 - 29 mmol/L 23   Calcium  8.7 - 10.3 mg/dL 9.2   Total Protein 6.0 - 8.5 g/dL 6.4   Total Bilirubin 0.0 - 1.2 mg/dL 0.4   Alkaline Phos 44 - 121 IU/L 47   AST 0 - 40 IU/L 18   ALT 0 - 32 IU/L 13       Latest Ref Rng & Units 11/13/2023   10:58 AM  CBC  WBC 4.0 - 10.5 K/uL 7.4   Hemoglobin 12.0 - 15.0 g/dL 24.4   Hematocrit 01.0 - 46.0 % 50.7   Platelets 150 - 400 K/uL 212    I have personally reviewed Radiology images listed below: No images are attached to the encounter.  No results found.   Assessment and plan- Patient is a 63 y.o. female here for routine follow-up of secondary polycythemia  Patient's hemoglobin has remained stable around 16 without a clear rising trend.  Workup for secondary polycythemia including JAK2 levels were negative and EPO levels were unremarkable.  Patient is a lifelong non-smoker.  She works night shifts but does get good sleep when she eventually goes to bed after her work.  Given the stability of her Blood work I would like to have her follow-up with her primary care doctor at this time.  She can be referred to us  in the future if there is a consistent increase in her hemoglobin levels.  No indication for phlebotomy or bone marrow biopsy at this time   Visit Diagnosis 1. Polycythemia, secondary  Dr. Seretha Dance, MD, MPH [facility] [phone] 11/15/2023 2:21 PM

## 2023-12-16 ENCOUNTER — Telehealth: Payer: Self-pay

## 2023-12-16 NOTE — Telephone Encounter (Signed)
 Spoke with Larinda Plover at [facility] who states that they are refilling patient's prescription and does not need anything from prescriber.

## 2023-12-16 NOTE — Telephone Encounter (Signed)
 Copied from CRM [id number]. Topic: Clinical - Prescription Issue >> Dec 16, 2023  9:39 AM Crispin Dolphin wrote: Reason for CRM: Patient called about RX for levothyroxine  (SYNTHROID ) 112 MCG tablet. Pharmacy says they need referral from provider. Let her know it shows on refill. She says pharmacy unable to fill. Patient states she is going out of town tomorrow and needs filled by tomorrow so she will have for trip. Confirmed correct pharmacy - [place]. Thank You

## 2024-01-16 NOTE — Telephone Encounter (Signed)
dupkicate

## 2024-01-16 NOTE — Telephone Encounter (Signed)
 Please advise on refill request

## 2024-01-16 NOTE — Telephone Encounter (Signed)
 Duplicate request. Due to medication, will not let me refuse it. Routing to PCP.

## 2024-01-27 ENCOUNTER — Ambulatory Visit: Admitting: Family Medicine

## 2024-01-30 ENCOUNTER — Ambulatory Visit: Admitting: Family Medicine

## 2024-01-30 ENCOUNTER — Encounter: Payer: Self-pay | Admitting: Family Medicine

## 2024-01-30 VITALS — BP 113/67 | HR 78 | Temp 97.9°F | Resp 18 | Ht 62.0 in | Wt 128.0 lb

## 2024-01-30 DIAGNOSIS — E039 Hypothyroidism, unspecified: Secondary | ICD-10-CM | POA: Diagnosis not present

## 2024-01-30 DIAGNOSIS — Z1231 Encounter for screening mammogram for malignant neoplasm of breast: Secondary | ICD-10-CM

## 2024-01-30 DIAGNOSIS — E118 Type 2 diabetes mellitus with unspecified complications: Secondary | ICD-10-CM | POA: Diagnosis not present

## 2024-01-30 DIAGNOSIS — E782 Mixed hyperlipidemia: Secondary | ICD-10-CM | POA: Diagnosis not present

## 2024-01-30 DIAGNOSIS — M48061 Spinal stenosis, lumbar region without neurogenic claudication: Secondary | ICD-10-CM

## 2024-01-30 DIAGNOSIS — M4185 Other forms of scoliosis, thoracolumbar region: Secondary | ICD-10-CM | POA: Diagnosis not present

## 2024-01-30 DIAGNOSIS — Z1211 Encounter for screening for malignant neoplasm of colon: Secondary | ICD-10-CM

## 2024-01-30 MED ORDER — HYDROCODONE-ACETAMINOPHEN 5-325 MG PO TABS: 1.0000 | ORAL_TABLET | Freq: Four times a day (QID) | ORAL | 0 refills | Status: DC | PRN

## 2024-01-30 NOTE — Progress Notes (Signed)
 Established Patient Office Visit  Subjective   Patient ID: Ashley Delgado, female    DOB: [birth year]  Age: 63 y.o. MRN: [id number]  Chief Complaint  Patient presents with   Medical Management of Chronic Issues    HPI delightful 63 year old with polycythemia secondary (evaluated by hematology, no source found), alopecia, DMT2, mixed hyperlipidemia, hypothyroidism, bilateral lumbar radiculopathy with spinal stenosis, scoliosis.  Her left knee is greatly improved.  She has been having pain which she assumed was arthritis and is just stopped hurting.  Her back is better as well.  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  She still has very painful hand, osteoarthritis principally her DIPs. She has not had a colonoscopy in a few years and she thinks she last had one in [place] .  She has been in [place]  for years. She needs a Pap smear, asked her to get scheduled for that.  She is also due for a mammogram. She is doing very well with diabetes.  She is on Jardiance  25 mg, Trulicity  4.5 mg weekly and metformin  1000 mg twice daily.  She denies any episodes of low blood sugar such as confusion, shakiness, diaphoresis or headache.      ROS    Objective:     BP 113/67 (BP Location: Left Arm, Patient Position: Sitting, Cuff Size: Normal)   Pulse 78   Temp 97.9 F (36.6 C) (Oral)   Resp 18   Ht 5' 2 (1.575 m)   Wt 128 lb (58.1 kg)   SpO2 95%   BMI 23.41 kg/m    Physical Exam Vitals and nursing note reviewed.  Constitutional:      Appearance: Normal appearance.  HENT:     Head: Normocephalic and atraumatic.  Eyes:     Conjunctiva/sclera: Conjunctivae normal.  Cardiovascular:     Rate and Rhythm: Normal rate and regular rhythm.  Pulmonary:     Effort: Pulmonary effort is normal.     Breath sounds: Normal breath sounds.  Musculoskeletal:     Right lower leg: No edema.     Left lower leg: No edema.  Skin:    General: Skin is warm and dry.   Neurological:     Mental Status: She is alert and oriented to person, place, and time.  Psychiatric:        Mood and Affect: Mood normal.        Behavior: Behavior normal.        Thought Content: Thought content normal.        Judgment: Judgment normal.          No results found for any visits on 01/30/24.    The 10-year ASCVD risk score (Arnett DK, et al., 2019) is: 8.4%    Assessment & Plan:  Controlled type 2 diabetes mellitus with complication, without long-term current use of insulin (HCC) Assessment & Plan: She is on metformin  1000 twice daily, Jardiance  25 mg and Trulicity  4.5 mg weekly.  She also takes lisinopril  40 mg daily.  Reminded her to take an 81 mg aspirin at suppertime.  She checks her feet routinely.  Checking lipids, CMP, A1c today.  Orders: -     Lipid panel -     CBC with Differential/Platelet -     Comprehensive metabolic panel with GFR -     Hemoglobin A1c -     Microalbumin / creatinine urine ratio  Other form of scoliosis of thoracolumbar spine -  HYDROcodone -Acetaminophen ; Take 1 tablet by mouth every 6 (six) hours as needed for moderate pain (pain score 4-6) (scoliosis).  Dispense: 60 tablet; Refill: 0 -     HYDROcodone -Acetaminophen ; Take 1 tablet by mouth every 6 (six) hours as needed for moderate pain (pain score 4-6) (scoliosis).  Dispense: 60 tablet; Refill: 0 -     HYDROcodone -Acetaminophen ; Take 1 tablet by mouth every 6 (six) hours as needed for moderate pain (pain score 4-6) (scoliosis).  Dispense: 60 tablet; Refill: 0  Acquired hypothyroidism -     TSH + free T4  Screening for colon cancer -     Ambulatory referral to Gastroenterology  Encounter for screening mammogram for malignant neoplasm of breast -     3D Screening Mammogram, Left and Right; Future  Hypothyroidism (acquired) Assessment & Plan: Checking her TSH and free T4 today   Mixed hyperlipidemia Assessment & Plan: Checking her lipids today.  Goal is LDL 70 or  less.   Degenerative lumbar spinal stenosis Assessment & Plan: Checked her PDMP and she has not received controlled medications from anyone else.  Last fill date for hydrocodone  was was 01/14/2024.  Refilled her pain medication, hydrocodone  acetaminophen  5-3 25 #60 a month for the months of July, August and September.        No follow-ups on file.    Dominque Levandowski K Kyre Jeffries, MD

## 2024-01-30 NOTE — Assessment & Plan Note (Signed)
 Checking her lipids today.  Goal is LDL 70 or less.

## 2024-01-30 NOTE — Assessment & Plan Note (Addendum)
 She is on metformin  1000 twice daily, Jardiance  25 mg and Trulicity  4.5 mg weekly.  She also takes lisinopril  40 mg daily.  Reminded her to take an 81 mg aspirin at suppertime.  She checks her feet routinely.  Checking lipids, CMP, A1c today.

## 2024-01-30 NOTE — Assessment & Plan Note (Addendum)
 Checked her PDMP and she has not received controlled medications from anyone else.  Last fill date for hydrocodone  was was 01/14/2024.  Refilled her pain medication, hydrocodone  acetaminophen  5-3 25 #60 a month for the months of July, August and September.

## 2024-01-30 NOTE — Assessment & Plan Note (Signed)
 Checking her TSH and free T4 today

## 2024-02-01 LAB — CBC WITH DIFFERENTIAL/PLATELET
Basophils Absolute: 0 x10E3/uL (ref 0.0–0.2)
Basos: 1 %
EOS (ABSOLUTE): 0.1 x10E3/uL (ref 0.0–0.4)
Eos: 2 %
Hematocrit: 49.1 % — ABNORMAL HIGH (ref 34.0–46.6)
Hemoglobin: 16 g/dL — ABNORMAL HIGH (ref 11.1–15.9)
Immature Grans (Abs): 0 x10E3/uL (ref 0.0–0.1)
Immature Granulocytes: 0 %
Lymphocytes Absolute: 2.4 x10E3/uL (ref 0.7–3.1)
Lymphs: 41 %
MCH: 29.5 pg (ref 26.6–33.0)
MCHC: 32.6 g/dL (ref 31.5–35.7)
MCV: 90 fL (ref 79–97)
Monocytes Absolute: 0.5 x10E3/uL (ref 0.1–0.9)
Monocytes: 9 %
Neutrophils Absolute: 2.7 x10E3/uL (ref 1.4–7.0)
Neutrophils: 47 %
Platelets: 201 x10E3/uL (ref 150–450)
RBC: 5.43 x10E6/uL — ABNORMAL HIGH (ref 3.77–5.28)
RDW: 14.6 % (ref 11.7–15.4)
WBC: 5.8 x10E3/uL (ref 3.4–10.8)

## 2024-02-01 LAB — HEMOGLOBIN A1C
Est. average glucose Bld gHb Est-mCnc: 143 mg/dL
Hgb A1c MFr Bld: 6.6 % — ABNORMAL HIGH (ref 4.8–5.6)

## 2024-02-01 LAB — COMPREHENSIVE METABOLIC PANEL WITH GFR
ALT: 13 IU/L (ref 0–32)
AST: 15 IU/L (ref 0–40)
Albumin: 4 g/dL (ref 3.9–4.9)
Alkaline Phosphatase: 48 IU/L (ref 44–121)
BUN/Creatinine Ratio: 23 (ref 12–28)
BUN: 16 mg/dL (ref 8–27)
Bilirubin Total: 0.3 mg/dL (ref 0.0–1.2)
CO2: 21 mmol/L (ref 20–29)
Calcium: 8.9 mg/dL (ref 8.7–10.3)
Chloride: 103 mmol/L (ref 96–106)
Creatinine, Ser: 0.69 mg/dL (ref 0.57–1.00)
Globulin, Total: 2.1 g/dL (ref 1.5–4.5)
Glucose: 106 mg/dL — ABNORMAL HIGH (ref 70–99)
Potassium: 4.3 mmol/L (ref 3.5–5.2)
Sodium: 143 mmol/L (ref 134–144)
Total Protein: 6.1 g/dL (ref 6.0–8.5)
eGFR: 97 mL/min/1.73 (ref >59)

## 2024-02-01 LAB — TSH+FREE T4
Free T4: 1.75 ng/dL (ref 0.82–1.77)
TSH: 0.159 u[IU]/mL — ABNORMAL LOW (ref 0.450–4.500)

## 2024-02-01 LAB — LIPID PANEL
Chol/HDL Ratio: 2.5 ratio (ref 0.0–4.4)
Cholesterol, Total: 113 mg/dL (ref 100–199)
HDL: 46 mg/dL (ref >39)
LDL Chol Calc (NIH): 48 mg/dL (ref 0–99)
Triglycerides: 100 mg/dL (ref 0–149)
VLDL Cholesterol Cal: 19 mg/dL (ref 5–40)

## 2024-02-01 LAB — MICROALBUMIN / CREATININE URINE RATIO
Creatinine, Urine: 67.5 mg/dL
Microalb/Creat Ratio: 12 mg/g{creat} (ref 0–29)
Microalbumin, Urine: 8.2 ug/mL

## 2024-02-03 ENCOUNTER — Ambulatory Visit: Payer: Self-pay | Admitting: Family Medicine

## 2024-02-03 ENCOUNTER — Other Ambulatory Visit: Payer: Self-pay | Admitting: Family Medicine

## 2024-02-03 ENCOUNTER — Encounter (INDEPENDENT_AMBULATORY_CARE_PROVIDER_SITE_OTHER): Payer: Self-pay | Admitting: *Deleted

## 2024-02-03 MED ORDER — LEVOTHYROXINE SODIUM 100 MCG PO TABS: 100.0000 ug | ORAL_TABLET | Freq: Every day | ORAL | 3 refills | Status: AC

## 2024-02-06 ENCOUNTER — Ambulatory Visit: Payer: Self-pay

## 2024-02-06 NOTE — Telephone Encounter (Addendum)
 FYI Only or Action Required?: FYI only for provider.  Patient was last seen in primary care on 01/30/2024 by Ziglar, Susan K, MD.  Called Nurse Triage reporting Flank Pain.  Symptoms began a week ago.  Interventions attempted: Nothing.  Symptoms are: unchanged.  Triage Disposition: See Physician Within 24 Hours, Call PCP Within 24 Hours  Patient/caregiver understands and will follow disposition?: Yes  Apt next Wednesday per request.   First attempt made; left vm. Second attempt; no answer  side ache on the right side.. worsening

## 2024-02-12 ENCOUNTER — Encounter: Payer: Self-pay | Admitting: Family Medicine

## 2024-02-12 ENCOUNTER — Ambulatory Visit
Admission: RE | Admit: 2024-02-12 | Discharge: 2024-02-12 | Disposition: A | Discharge status: Home / Self Care | Source: Ambulatory Visit | Attending: Family Medicine | Admitting: Family Medicine

## 2024-02-12 ENCOUNTER — Ambulatory Visit: Payer: Self-pay | Admitting: Family Medicine

## 2024-02-12 ENCOUNTER — Ambulatory Visit: Admitting: Family Medicine

## 2024-02-12 ENCOUNTER — Telehealth: Payer: Self-pay

## 2024-02-12 VITALS — BP 105/65 | HR 89 | Temp 98.1°F | Resp 18 | Ht 62.0 in | Wt 127.0 lb

## 2024-02-12 DIAGNOSIS — R109 Unspecified abdominal pain: Secondary | ICD-10-CM | POA: Insufficient documentation

## 2024-02-12 DIAGNOSIS — M1909 Primary osteoarthritis, other specified site: Secondary | ICD-10-CM

## 2024-02-12 DIAGNOSIS — M19049 Primary osteoarthritis, unspecified hand: Secondary | ICD-10-CM | POA: Diagnosis not present

## 2024-02-12 DIAGNOSIS — R10A1 Flank pain, right side: Secondary | ICD-10-CM | POA: Insufficient documentation

## 2024-02-12 LAB — POCT URINALYSIS DIP (CLINITEK)
Bilirubin, UA: NEGATIVE
Glucose, UA: NEGATIVE mg/dL
Ketones, POC UA: NEGATIVE mg/dL
Leukocytes, UA: NEGATIVE
Nitrite, UA: NEGATIVE
POC PROTEIN,UA: NEGATIVE
Spec Grav, UA: 1.015 (ref 1.010–1.025)
Urobilinogen, UA: 0.2 U/dL
pH, UA: 6.5 (ref 5.0–8.0)

## 2024-02-12 MED ORDER — DICLOFENAC SODIUM 1 % EX GEL: 4.0000 g | Freq: Four times a day (QID) | CUTANEOUS | 11 refills | Status: AC

## 2024-02-12 NOTE — Assessment & Plan Note (Signed)
 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  Voltaren  gel.

## 2024-02-12 NOTE — Progress Notes (Signed)
 Established Patient Office Visit  Subjective   Patient ID: Ashley Delgado, female    DOB: [birth year]  Age: 63 y.o. 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  Discussed the use of AI scribe software for clinical note transcription with the patient, who gave verbal consent to proceed.  Ashley Delgado is a 63 year old female who presents with intermittent right-sided flank pain. She experiences intermittent severe pain on the right side near the kidney area. The pain is debilitating at times, requiring pain medication and topical treatments for relief. It is not constant and improves with movement, especially during work. Some days are worse than others, but she is not experiencing pain today.  She has not had any fever, chills or nightsweats.  She denies dysuria, frequency or nocturia.    She fell yesterday climbing stairs and hit her left knee, and head.  No LOC.  She has no neck pain.  She has soreness in her left arm and knee. There was no loss of consciousness, and the head pain was momentary. Her knee was notably impacted, but wearing pants may have mitigated the injury. Her arm remains sore, but her primary concern is the flank pain.  She uses Voltaren  gel for her hands, which she finds effective for pain relief. She has been consuming a significant amount of sugar recently, which she attributes to elevated sugar levels in her urine.      ROS    Objective:     BP 105/65 (BP Location: Left Arm, Patient Position: Sitting, Cuff Size: Normal)   Pulse 89   Temp 98.1 F (36.7 C) (Oral)   Resp 18   Ht 5' 2 (1.575 m)   Wt 127 lb (57.6 kg)   SpO2 96%   BMI 23.23 kg/m    Physical Exam Vitals and nursing note  reviewed.  Constitutional:      Appearance: Normal appearance.  HENT:     Head: Normocephalic and atraumatic.  Eyes:     Conjunctiva/sclera: Conjunctivae normal.  Cardiovascular:     Rate and Rhythm: Normal rate and regular rhythm.  Pulmonary:     Effort: Pulmonary effort is normal.     Breath sounds: Normal breath sounds.  Abdominal:     Tenderness: There is no right CVA tenderness or left CVA tenderness.  Musculoskeletal:     Right lower leg: No edema.     Left lower leg: No edema.  Skin:    General: Skin is warm and dry.  Neurological:     Mental Status: She is alert and oriented to person, place, and time.  Psychiatric:        Mood and Affect: Mood normal.        Behavior: Behavior normal.        Thought Content: Thought content normal.        Judgment: Judgment normal.          Results for orders placed or performed in visit on 02/12/24  POCT URINALYSIS DIP (CLINITEK)  Result Value Ref Range   Color, UA yellow yellow   Clarity, UA clear clear   Glucose, UA negative negative mg/dL   Bilirubin, UA negative negative   Ketones, POC UA negative negative mg/dL   Spec Grav, UA 8.984 8.989 - 1.025  Blood, UA trace-intact (A) negative   pH, UA 6.5 5.0 - 8.0   POC PROTEIN,UA negative negative, trace   Urobilinogen, UA 0.2 0.2 or 1.0 E.U./dL   Nitrite, UA Negative Negative   Leukocytes, UA Negative Negative      The ASCVD Risk score (Arnett DK, et al., 2019) failed to calculate for the following reasons:   The valid total cholesterol range is 130 to 320 mg/dL    Assessment & Plan:  Right flank pain -     POCT URINALYSIS DIP (CLINITEK) -     CT RENAL STONE STUDY; Future  Primary osteoarthritis of other site  OA (osteoarthritis) of finger, unspecified laterality Assessment & Plan: 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  Voltaren  gel.    Orders: -     Diclofenac  Sodium; Apply 4 g topically 4 (four) times daily. To affected joint.  Dispense: 100 g;  Refill: 11     No follow-ups on file.    Monnica Saltsman K Kamee Bobst, MD

## 2024-02-12 NOTE — Telephone Encounter (Signed)
 Patient states that insurance does not cover Diclofenac  gel.

## 2024-02-12 NOTE — Addendum Note (Signed)
 Addended by: RAYANN REXENE HERO on: 02/12/2024 10:17 AM   Modules accepted: Orders

## 2024-03-02 ENCOUNTER — Other Ambulatory Visit: Payer: Self-pay | Admitting: Family Medicine

## 2024-03-02 DIAGNOSIS — M4185 Other forms of scoliosis, thoracolumbar region: Secondary | ICD-10-CM

## 2024-04-23 ENCOUNTER — Other Ambulatory Visit: Payer: Self-pay | Admitting: Family Medicine

## 2024-04-23 DIAGNOSIS — E118 Type 2 diabetes mellitus with unspecified complications: Secondary | ICD-10-CM

## 2024-04-27 ENCOUNTER — Other Ambulatory Visit: Payer: Self-pay | Admitting: Family Medicine

## 2024-04-27 DIAGNOSIS — E118 Type 2 diabetes mellitus with unspecified complications: Secondary | ICD-10-CM

## 2024-04-27 MED ORDER — TRULICITY 4.5 MG/0.5ML ~~LOC~~ SOAJ: 4.5000 mg | SUBCUTANEOUS | 3 refills | Status: AC

## 2024-04-27 NOTE — Telephone Encounter (Signed)
 Copied from CRM #[id number]. Topic: Clinical - Medication Refill >> Apr 27, 2024 11:09 AM Kendralyn S wrote: Medication:  TRULICITY  4.5 MG/0.5ML SOAJ    Has the patient contacted their pharmacy? Yes (Agent: If no, request that the patient contact the pharmacy for the refill. If patient does not wish to contact the pharmacy document the reason why and proceed with request.) (Agent: If yes, when and what did the pharmacy advise?)  This is the patient's preferred pharmacy:  [facility] - [place], [place] - [address] [place] [address] Phone: [phone] Fax: [phone]  Is this the correct pharmacy for this prescription? Yes If no, delete pharmacy and type the correct one.   Has the prescription been filled recently? Yes  Is the patient out of the medication? Yes  Has the patient been seen for an appointment in the last year OR does the patient have an upcoming appointment? Yes  Can we respond through MyChart? No  Agent: Please be advised that Rx refills may take up to 3 business days. We ask that you follow-up with your pharmacy.

## 2024-05-01 ENCOUNTER — Other Ambulatory Visit: Payer: Self-pay | Admitting: Family Medicine

## 2024-05-01 DIAGNOSIS — I10 Essential (primary) hypertension: Secondary | ICD-10-CM

## 2024-05-04 ENCOUNTER — Ambulatory Visit (INDEPENDENT_AMBULATORY_CARE_PROVIDER_SITE_OTHER): Admitting: Family Medicine

## 2024-05-04 ENCOUNTER — Encounter: Payer: Self-pay | Admitting: Family Medicine

## 2024-05-04 VITALS — BP 126/70 | HR 81 | Temp 97.9°F | Resp 18 | Ht 62.0 in | Wt 132.0 lb

## 2024-05-04 DIAGNOSIS — E039 Hypothyroidism, unspecified: Secondary | ICD-10-CM

## 2024-05-04 DIAGNOSIS — I10 Essential (primary) hypertension: Secondary | ICD-10-CM | POA: Diagnosis not present

## 2024-05-04 DIAGNOSIS — Z23 Encounter for immunization: Secondary | ICD-10-CM | POA: Insufficient documentation

## 2024-05-04 DIAGNOSIS — M4185 Other forms of scoliosis, thoracolumbar region: Secondary | ICD-10-CM

## 2024-05-04 DIAGNOSIS — E118 Type 2 diabetes mellitus with unspecified complications: Secondary | ICD-10-CM | POA: Diagnosis not present

## 2024-05-04 DIAGNOSIS — Z136 Encounter for screening for cardiovascular disorders: Secondary | ICD-10-CM | POA: Insufficient documentation

## 2024-05-04 DIAGNOSIS — Z9189 Other specified personal risk factors, not elsewhere classified: Secondary | ICD-10-CM

## 2024-05-04 LAB — POCT GLYCOSYLATED HEMOGLOBIN (HGB A1C): Hemoglobin A1C: 6.5 % — AB (ref 4.0–5.6)

## 2024-05-04 MED ORDER — HYDROCODONE-ACETAMINOPHEN 5-325 MG PO TABS: 1.0000 | ORAL_TABLET | Freq: Four times a day (QID) | ORAL | 0 refills | Status: AC | PRN

## 2024-05-04 MED ORDER — HYDROCODONE-ACETAMINOPHEN 5-325 MG PO TABS: 1.0000 | ORAL_TABLET | Freq: Four times a day (QID) | ORAL | 0 refills | Status: DC | PRN

## 2024-05-04 NOTE — Assessment & Plan Note (Addendum)
 A1c is 6.5% today.  She is on metformin  1000 twice daily Trulicity  4.5 mg weekly and Jardiance  25 mg daily.  She denies hypoglycemia and reports she is doing well.  Normal foot exam today.  Will check lipid profile, CBC CMP and urine microalbumin creatinine ratio.

## 2024-05-04 NOTE — Assessment & Plan Note (Signed)
 Today she declines a flu vaccine today.  She does not get COVID vaccines.

## 2024-05-04 NOTE — Assessment & Plan Note (Signed)
 She takes levothyroxine  100 mcg daily.  She has lost 30 pounds in the last 2 years.  Will check her TSH and free T4.

## 2024-05-04 NOTE — Assessment & Plan Note (Signed)
 She is on Crestor  10 mg daily.  Checking lipid panel today.  Goal is LDL less than or equal to 70.  Discussed CT coronary artery study to see if her Crestor  is adequate.  This test is $250 and is out-of-pocket.  She is going to think about it and call me and let me know when she wants to do it

## 2024-05-04 NOTE — Progress Notes (Signed)
 Established Patient Office Visit  Subjective   Patient ID: Ashley Delgado, female    DOB: [birth year]  Age: 63 y.o. MRN: [id number]  Chief Complaint  Patient presents with   Medical Management of Chronic Issues    HPI Discussed the use of AI scribe software for clinical note transcription with the patient, who gave verbal consent to proceed.  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   She has type 2 diabetes and is currently on her GLP-1, Trulicity  4.5mg ,  taken once a week. Her A1c improved from 11 to 6.6% after moving from [place] . Her A1c today is 6.6%.  She also takes metformin  1000 mg twice daily and Jardiance  25mg . She has discontinued insulin, Toujeo  and attributes improved diabetes control to her medication regimen and increased physical activity at work.  She experiences back pain, particularly related to sciatica and has severe scoliosis, with occasional radiation down her legs. She has stopped lifting at work to prevent exacerbation. She uses Voltaren  gel for severe OA of her hands, which is worse in the mornings, and occasionally uses Vicks for relief. She has lost approximately 30 pounds over the past couple of years, which has alleviated some back pain.  Her allergies have been well-controlled this year, requiring montelukast  only about four times. No shortness of breath.  She has a family history of heart disease, including a brother who had a heart attack and parents with heart conditions. She takes aspirin daily as a preventive measure.  Discussed the CT coronary calcium  artery scan.  Her brother recently moved in with her, and she is assisting him in obtaining Medicaid.  She visited the eye doctor this year and received new glasses, with a thorough eye examination due  to her diabetes.     ROS    Objective:     BP 126/70 (BP Location: Left Arm, Patient Position: Sitting, Cuff Size: Normal)   Pulse 81   Temp 97.9 F (36.6 C) (Oral)   Resp 18   Ht 5' 2 (1.575 m)   Wt 132 lb (59.9 kg)   SpO2 96%   BMI 24.14 kg/m    Physical Exam Vitals and nursing note reviewed.  Constitutional:      Appearance: Normal appearance.  HENT:     Head: Normocephalic and atraumatic.  Eyes:     Conjunctiva/sclera: Conjunctivae normal.  Cardiovascular:     Rate and Rhythm: Normal rate and regular rhythm.     Pulses:          Dorsalis pedis pulses are 2+ on the right side and 2+ on the left side.  Pulmonary:     Effort: Pulmonary effort is normal.     Breath sounds: Normal breath sounds.  Musculoskeletal:     Right lower leg: No edema.     Left lower leg: No edema.  Feet:     Right foot:     Protective Sensation: 5 sites tested.  5 sites sensed.     Toenail Condition: Right toenails are abnormally thick.     Left foot:     Protective Sensation: 5 sites tested.  5 sites sensed.     Toenail Condition: Left toenails are abnormally thick.     Comments: Hair on toes Skin:    General: Skin is warm and dry.  Neurological:     Mental Status: She  is alert and oriented to person, place, and time.  Psychiatric:        Mood and Affect: Mood normal.        Behavior: Behavior normal.        Thought Content: Thought content normal.        Judgment: Judgment normal.          Results for orders placed or performed in visit on 05/04/24  POCT glycosylated hemoglobin (Hb A1C)  Result Value Ref Range   Hemoglobin A1C 6.5 (A) 4.0 - 5.6 %   HbA1c POC (<> result, manual entry)     HbA1c, POC (prediabetic range)     HbA1c, POC (controlled diabetic range)        The ASCVD Risk score (Arnett DK, et al., 2019) failed to calculate for the following reasons:   The valid total cholesterol range is 130 to 320 mg/dL    Assessment & Plan:  Controlled diabetes  mellitus type 2 with complications (HCC) Assessment & Plan: A1c is 6.5% today.  She is on metformin  1000 twice daily Trulicity  4.5 mg weekly and Jardiance  25 mg daily.  She denies hypoglycemia and reports she is doing well.  Normal foot exam today.  Will check lipid profile, CBC CMP and urine microalbumin creatinine ratio.  Orders: -     POCT glycosylated hemoglobin (Hb A1C) -     Lipid panel -     CBC with Differential/Platelet -     Comprehensive metabolic panel with GFR -     Microalbumin / creatinine urine ratio  Primary hypertension -     TSH + free T4  Other form of scoliosis of thoracolumbar spine -     HYDROcodone -Acetaminophen ; Take 1 tablet by mouth every 6 (six) hours as needed for moderate pain (pain score 4-6) (scoliosis).  Dispense: 60 tablet; Refill: 0 -     HYDROcodone -Acetaminophen ; Take 1 tablet by mouth every 6 (six) hours as needed for moderate pain (pain score 4-6) (scoliosis).  Dispense: 60 tablet; Refill: 0 -     HYDROcodone -Acetaminophen ; Take 1 tablet by mouth every 6 (six) hours as needed for moderate pain (pain score 4-6) (scoliosis).  Dispense: 60 tablet; Refill: 0  Immunization due Assessment & Plan: Today she declines a flu vaccine today.  She does not get COVID vaccines.   Hypothyroidism (acquired) Assessment & Plan: She takes levothyroxine  100 mcg daily.  She has lost 30 pounds in the last 2 years.  Will check her TSH and free T4.   Encounter for screening for coronary artery disease in patient with risk for coronary artery disease greater than 20% in next 10 years Assessment & Plan: She is on Crestor  10 mg daily.  Checking lipid panel today.  Goal is LDL less than or equal to 70.  Discussed CT coronary calcium  study to see if her Crestor  dosage is adequate.  This test is $250 and is out-of-pocket.  She is going to think about it and call me and let me know if she wants to do it       Return in about 3 months (around 08/04/2024).    Nary Sneed K  Morrigan Wickens, MD

## 2024-05-05 ENCOUNTER — Ambulatory Visit: Payer: Self-pay | Admitting: Family Medicine

## 2024-05-05 LAB — COMPREHENSIVE METABOLIC PANEL WITH GFR
ALT: 13 IU/L (ref 0–32)
AST: 15 IU/L (ref 0–40)
Albumin: 4.1 g/dL (ref 3.9–4.9)
Alkaline Phosphatase: 52 IU/L (ref 49–135)
BUN/Creatinine Ratio: 18 (ref 12–28)
BUN: 14 mg/dL (ref 8–27)
Bilirubin Total: 0.4 mg/dL (ref 0.0–1.2)
CO2: 24 mmol/L (ref 20–29)
Calcium: 8.9 mg/dL (ref 8.7–10.3)
Chloride: 104 mmol/L (ref 96–106)
Creatinine, Ser: 0.76 mg/dL (ref 0.57–1.00)
Globulin, Total: 2.1 g/dL (ref 1.5–4.5)
Glucose: 123 mg/dL — ABNORMAL HIGH (ref 70–99)
Potassium: 4.4 mmol/L (ref 3.5–5.2)
Sodium: 142 mmol/L (ref 134–144)
Total Protein: 6.2 g/dL (ref 6.0–8.5)
eGFR: 87 mL/min/1.73 (ref >59)

## 2024-05-05 LAB — LIPID PANEL
Chol/HDL Ratio: 2.5 ratio (ref 0.0–4.4)
Cholesterol, Total: 113 mg/dL (ref 100–199)
HDL: 45 mg/dL (ref >39)
LDL Chol Calc (NIH): 51 mg/dL (ref 0–99)
Triglycerides: 87 mg/dL (ref 0–149)
VLDL Cholesterol Cal: 17 mg/dL (ref 5–40)

## 2024-05-05 LAB — CBC WITH DIFFERENTIAL/PLATELET
Basophils Absolute: 0 x10E3/uL (ref 0.0–0.2)
Basos: 0 %
EOS (ABSOLUTE): 0.1 x10E3/uL (ref 0.0–0.4)
Eos: 2 %
Hematocrit: 46.5 % (ref 34.0–46.6)
Hemoglobin: 15.2 g/dL (ref 11.1–15.9)
Immature Grans (Abs): 0 x10E3/uL (ref 0.0–0.1)
Immature Granulocytes: 0 %
Lymphocytes Absolute: 2.2 x10E3/uL (ref 0.7–3.1)
Lymphs: 34 %
MCH: 30.9 pg (ref 26.6–33.0)
MCHC: 32.7 g/dL (ref 31.5–35.7)
MCV: 95 fL (ref 79–97)
Monocytes Absolute: 0.5 x10E3/uL (ref 0.1–0.9)
Monocytes: 7 %
Neutrophils Absolute: 3.5 x10E3/uL (ref 1.4–7.0)
Neutrophils: 57 %
Platelets: 197 x10E3/uL (ref 150–450)
RBC: 4.92 x10E6/uL (ref 3.77–5.28)
RDW: 14.2 % (ref 11.7–15.4)
WBC: 6.3 x10E3/uL (ref 3.4–10.8)

## 2024-05-05 LAB — MICROALBUMIN / CREATININE URINE RATIO
Creatinine, Urine: 64.4 mg/dL
Microalb/Creat Ratio: 8 mg/g{creat} (ref 0–29)
Microalbumin, Urine: 5.2 ug/mL

## 2024-05-05 LAB — TSH+FREE T4
Free T4: 1.71 ng/dL (ref 0.82–1.77)
TSH: 0.165 u[IU]/mL — ABNORMAL LOW (ref 0.450–4.500)

## 2024-05-18 ENCOUNTER — Other Ambulatory Visit: Payer: Self-pay | Admitting: Family Medicine

## 2024-05-18 DIAGNOSIS — I10 Essential (primary) hypertension: Secondary | ICD-10-CM

## 2024-05-18 MED ORDER — LISINOPRIL 40 MG PO TABS: 40.0000 mg | ORAL_TABLET | Freq: Every day | ORAL | 1 refills | Status: AC

## 2024-05-18 NOTE — Telephone Encounter (Signed)
 Copied from CRM [id number]. Topic: Clinical - Medication Question >> May 18, 2024  1:44 PM Cleave MATSU wrote: Reason for CRM: pt needs Dr.ziglar to go ahead and approve lisinopril  and send it over to pharmacy

## 2024-05-21 ENCOUNTER — Ambulatory Visit
Admission: EM | Admit: 2024-05-21 | Discharge: 2024-05-21 | Disposition: A | Discharge status: Home / Self Care | Attending: Emergency Medicine | Admitting: Emergency Medicine

## 2024-05-21 DIAGNOSIS — H6692 Otitis media, unspecified, left ear: Secondary | ICD-10-CM

## 2024-05-21 DIAGNOSIS — J0100 Acute maxillary sinusitis, unspecified: Secondary | ICD-10-CM

## 2024-05-21 MED ORDER — AMOXICILLIN-POT CLAVULANATE 875-125 MG PO TABS: 1.0000 | ORAL_TABLET | Freq: Two times a day (BID) | ORAL | 0 refills | Status: AC

## 2024-05-21 NOTE — ED Provider Notes (Signed)
 Ashley Delgado    CSN: [id number] Arrival date & time: 05/21/24  0804      History   Chief Complaint Chief Complaint  Patient presents with   Nasal Congestion   Cough   Sore Throat    HPI Ashley Delgado is a 63 y.o. female.  Patient presents with 5-day history of congestion, runny nose, postnasal drip, ear pain, body aches, headache, cough, watery crusty eyes.  No fever, shortness of breath, vomiting, diarrhea.  No OTC medications taken today; took TheraFlu last night.  The history is provided by the patient and medical records.    Past Medical History:  Diagnosis Date   Allergic rhinitis    FROM [facility] RECORDS   Aneurysm of unspecified site    Arterial aneurysm    FROM [facility] RECORDS   Essential hypertension    Herpesviral infection    FROM [facility] RECORDS   Herpetic vulvovaginitis    FROM [facility] RECORDS   Hypothyroidism    FROM [facility] RECORDS   Mixed hypercholesterolemia and hypertriglyceridemia    FROM [facility] RECORDS   Pain in left knee    FROM [facility] RECORDS   Pure hypercholesterolemia, unspecified    FROM [facility] RECORDS   Type 2 diabetes mellitus with diabetic neuropathy (HCC)    FROM [facility] RECORDS   Type 2 diabetes mellitus without complication    FROM [facility] RECORDS   Urticaria, unspecified    FROM [facility] RECORDS   Viral infection    FROM [facility] RECORDS    Patient Active Problem List   Diagnosis Date Noted   Immunization due 05/04/2024   Encounter for screening for coronary artery disease in patient with risk for coronary artery disease greater than 20% in next 10 years 05/04/2024   Right flank pain 02/12/2024   OA (osteoarthritis) of finger, unspecified laterality 02/12/2024   Nocturnal leg cramps 10/28/2023   OA (osteoarthritis) of knee 10/28/2023   Primary hypertension 10/28/2023   Mixed hyperlipidemia 10/28/2023   Hypothyroidism (acquired) 07/30/2023   Allergic rhinitis 07/30/2023   Controlled diabetes mellitus type 2 with complications (HCC) 07/30/2023    Insomnia disorder 07/30/2023   Degenerative lumbar spinal stenosis 05/02/2022    History reviewed. No pertinent surgical history.  OB History     Gravida  3   Para  3   Term  3   Preterm      AB      Living         SAB      IAB      Ectopic      Multiple      Live Births               Home Medications    Prior to Admission medications   Medication Sig Start Date End Date Taking? Authorizing Provider  amoxicillin-clavulanate (AUGMENTIN) 875-125 MG tablet Take 1 tablet by mouth every 12 (twelve) hours. 05/21/24  Yes Corlis Burnard DEL, NP  acyclovir  (ZOVIRAX ) 400 MG tablet Take 1 tablet (400 mg total) by mouth daily. 10/28/23   Ziglar, Susan K, MD  aspirin EC 81 MG tablet Take 81 mg by mouth daily. Swallow whole.    [provider]  diclofenac  Sodium (VOLTAREN ) 1 % GEL Apply 4 g topically 4 (four) times daily. To affected joint. 02/12/24   Ziglar, Susan K, MD  gabapentin  (NEURONTIN ) 300 MG capsule TAKE ONE CAPSULE (300 MG TOTAL) BY MOUTH THREE TIMES DAILY. 03/27/24   Ziglar, Susan K, MD  hydrochlorothiazide  (HYDRODIURIL )  25 MG tablet Take 1 tablet (25 mg total) by mouth daily. 10/28/23   Ziglar, Susan K, MD  HYDROcodone -acetaminophen  (NORCO/VICODIN) 5-325 MG tablet Take 1 tablet by mouth every 6 (six) hours as needed for moderate pain (pain score 4-6) (scoliosis). 02/11/24   Ziglar, Susan K, MD  HYDROcodone -acetaminophen  (NORCO/VICODIN) 5-325 MG tablet Take 1 tablet by mouth every 6 (six) hours as needed for moderate pain (pain score 4-6) (scoliosis). 06/29/24   Ziglar, Susan K, MD  HYDROcodone -acetaminophen  (NORCO/VICODIN) 5-325 MG tablet Take 1 tablet by mouth every 6 (six) hours as needed for moderate pain (pain score 4-6) (scoliosis). 06/01/24   Ziglar, Susan K, MD  HYDROcodone -acetaminophen  (NORCO/VICODIN) 5-325 MG tablet Take 1 tablet by mouth every 6 (six) hours as needed for moderate pain (pain score 4-6) (scoliosis). 05/04/24   Ziglar, Susan K, MD  Insulin Pen  Needle (PEN NEEDLES) 30G X 8 MM MISC 1 each by Does not apply route daily. 07/30/23   Ziglar, Susan K, MD  JARDIANCE  25 MG TABS tablet Take 1 tablet (25 mg total) by mouth daily. 10/28/23   Ziglar, Susan K, MD  levothyroxine  (SYNTHROID ) 100 MCG tablet Take 1 tablet (100 mcg total) by mouth daily. 02/03/24   Ziglar, Susan K, MD  lisinopril  (ZESTRIL ) 40 MG tablet Take 1 tablet (40 mg total) by mouth daily. 05/18/24   Ziglar, Susan K, MD  meloxicam  (MOBIC ) 15 MG tablet Take 1 tablet (15 mg total) by mouth daily. 10/28/23   Ziglar, Susan K, MD  metFORMIN  (GLUCOPHAGE ) 1000 MG tablet Take 1 tablet (1,000 mg total) by mouth 2 (two) times daily. 10/28/23   Ziglar, Susan K, MD  methocarbamol  (ROBAXIN ) 500 MG tablet Take 1 tablet (500 mg total) by mouth every 8 (eight) hours as needed for muscle spasms. 10/28/23   Ziglar, Susan K, MD  minoxidil  (LONITEN ) 2.5 MG tablet Take 1 tablet (2.5 mg total) by mouth daily. 10/28/23   Ziglar, Susan K, MD  mirtazapine  (REMERON ) 30 MG tablet Take 1 tablet (30 mg total) by mouth as needed. 10/28/23   Ziglar, Susan K, MD  montelukast  (SINGULAIR ) 10 MG tablet Take 1 tablet (10 mg total) by mouth at bedtime. 10/28/23   Ziglar, Susan K, MD  pantoprazole  (PROTONIX ) 40 MG tablet Take 1 tablet (40 mg total) by mouth daily. 10/28/23   Ziglar, Susan K, MD  rosuvastatin  (CRESTOR ) 10 MG tablet Take 1 tablet (10 mg total) by mouth at bedtime. 10/28/23   Ziglar, Susan K, MD  TOUJEO SOLOSTAR 300 UNIT/ML Solostar Pen Inject 10 Units into the skin at bedtime.    [provider]  TRULICITY  4.5 MG/0.5ML SOAJ Inject 4.5 mg into the skin once a week. 04/27/24   Ziglar, Devere POUR, MD    Family History Family History  Problem Relation Age of Onset   Heart attack Mother    Diabetes Mother    Hypertension Mother    Heart failure Father    Stroke Father    Diabetes Father    Hypertension Father    Hypertension Brother    Stroke Brother    Heart attack Brother    Leukemia Daughter    Thyroid  cancer Daughter     Social History Social History   Tobacco Use   Smoking status: Former    Current packs/day: 1.00    Types: Cigarettes    Passive exposure: Past   Smokeless tobacco: Never  Substance Use Topics   Alcohol use: Yes    Comment: occasional drinking  Allergies   Patient has no known allergies.   Review of Systems Review of Systems  Constitutional:  Negative for chills and fever.  HENT:  Positive for congestion, ear pain, postnasal drip, rhinorrhea and sore throat.   Respiratory:  Positive for cough. Negative for shortness of breath.   Neurological:  Positive for headaches.     Physical Exam Triage Vital Signs ED Triage Vitals [05/21/24 0809]  Encounter Vitals Group     BP      Girls Systolic BP Percentile      Girls Diastolic BP Percentile      Boys Systolic BP Percentile      Boys Diastolic BP Percentile      Pulse      Resp      Temp      Temp src      SpO2      Weight      Height      Head Circumference      Peak Flow      Pain Score 9     Pain Loc      Pain Education      Exclude from Growth Chart    No data found.  Updated Vital Signs BP 127/74   Pulse 78   Temp 98 F (36.7 C)   Resp 19   SpO2 98%   Visual Acuity Right Eye Distance:   Left Eye Distance:   Bilateral Distance:    Right Eye Near:   Left Eye Near:    Bilateral Near:     Physical Exam Constitutional:      General: She is not in acute distress. HENT:     Right Ear: Tympanic membrane normal.     Left Ear: Tympanic membrane is erythematous.     Nose: Congestion and rhinorrhea present.     Mouth/Throat:     Mouth: Mucous membranes are moist.     Pharynx: Oropharynx is clear.  Cardiovascular:     Rate and Rhythm: Normal rate and regular rhythm.     Heart sounds: Normal heart sounds.  Pulmonary:     Effort: Pulmonary effort is normal. No respiratory distress.     Breath sounds: Normal breath sounds.  Neurological:     Mental Status: She is alert.       UC Treatments / Results  Labs (all labs ordered are listed, but only abnormal results are displayed) Labs Reviewed - No data to display  EKG   Radiology No results found.  Procedures Procedures (including critical care time)  Medications Ordered in UC Medications - No data to display  Initial Impression / Assessment and Plan / UC Course  I have reviewed the triage vital signs and the nursing notes.  Pertinent labs & imaging results that were available during my care of the patient were reviewed by me and considered in my medical decision making (see chart for details).    Acute sinusitis, left otitis media.  Afebrile and vital signs are stable.  Lungs are clear and O2 sat is 98% on room air.  Patient has been symptomatic for 5 days and is not improving with OTC treatment.  Treating today with Augmentin.  Tylenol  or ibuprofen as needed.  Plain Mucinex as needed.  Instructed patient to follow-up with her PCP if she is not improving.  Education provided on sinus infection and ear infection.  She agrees to plan of care.  Final Clinical Impressions(s) / UC Diagnoses   Final diagnoses:  Acute non-recurrent maxillary sinusitis  Left otitis media, unspecified otitis media type     Discharge Instructions      Take the Augmentin as directed.  Follow-up with your primary care provider if your symptoms are not improving.      ED Prescriptions     Medication Sig Dispense Auth. Provider   amoxicillin-clavulanate (AUGMENTIN) 875-125 MG tablet Take 1 tablet by mouth every 12 (twelve) hours. 14 tablet Corlis Burnard DEL, NP      PDMP not reviewed this encounter.   Corlis Burnard DEL, NP 05/21/24 [phone]

## 2024-05-21 NOTE — ED Triage Notes (Signed)
 Patient to Urgent Care with complaints of cough/ crusty eyes/ nasal congestion/ headaches/ body aches. Possible fevers.   Symptoms x5 days.   Meds: ibuprofen

## 2024-05-21 NOTE — Discharge Instructions (Addendum)
 Take the Augmentin as directed.  Follow up with your primary care provider if your symptoms are not improving.

## 2024-06-01 ENCOUNTER — Other Ambulatory Visit: Payer: Self-pay | Admitting: Family Medicine

## 2024-06-01 DIAGNOSIS — E118 Type 2 diabetes mellitus with unspecified complications: Secondary | ICD-10-CM

## 2024-06-01 DIAGNOSIS — I10 Essential (primary) hypertension: Secondary | ICD-10-CM

## 2024-06-11 ENCOUNTER — Other Ambulatory Visit: Payer: Self-pay | Admitting: Family Medicine

## 2024-06-11 DIAGNOSIS — E118 Type 2 diabetes mellitus with unspecified complications: Secondary | ICD-10-CM

## 2024-06-11 NOTE — Telephone Encounter (Signed)
 Copied from CRM #[id number]. Topic: Clinical - Medication Refill >> Jun 11, 2024 11:23 AM Avram MATSU wrote: Medication: JARDIANCE  25 MG TABS tablet [[id number]]   Has the patient contacted their pharmacy? Yes (Agent: If no, request that the patient contact the pharmacy for the refill. If patient does not wish to contact the pharmacy document the reason why and proceed with request.) (Agent: If yes, when and what did the pharmacy advise?)  This is the patient's preferred pharmacy:  [facility] [place], [place] - [address] [place] [address] Phone: [phone] Fax: [phone]  Is this the correct pharmacy for this prescription? Yes If no, delete pharmacy and type the correct one.   Has the prescription been filled recently? No  Is the patient out of the medication? Yes  Has the patient been seen for an appointment in the last year OR does the patient have an upcoming appointment? Yes  Can we respond through MyChart? No  Agent: Please be advised that Rx refills may take up to 3 business days. We ask that you follow-up with your pharmacy.

## 2024-06-12 ENCOUNTER — Ambulatory Visit: Payer: Self-pay

## 2024-06-12 MED ORDER — JARDIANCE 25 MG PO TABS: 25.0000 mg | ORAL_TABLET | Freq: Every day | ORAL | 1 refills | Status: AC

## 2024-06-12 NOTE — Telephone Encounter (Signed)
 This RN attempted to contact patient, 3rd attempt, no answer, left vm to return call clinic. Routing [facility] clinic.

## 2024-06-12 NOTE — Telephone Encounter (Signed)
 This RN attempted to contact patient, 2nd attempt, no answer, left vm to return call clinic.

## 2024-06-12 NOTE — Telephone Encounter (Signed)
 This RN attempted to contact patient for triage. No answer, voicemail left requesting return call to clinic.

## 2024-06-13 ENCOUNTER — Other Ambulatory Visit: Payer: Self-pay | Admitting: Family Medicine

## 2024-06-13 DIAGNOSIS — I10 Essential (primary) hypertension: Secondary | ICD-10-CM

## 2024-06-20 ENCOUNTER — Other Ambulatory Visit: Payer: Self-pay | Admitting: Family Medicine

## 2024-06-20 DIAGNOSIS — E782 Mixed hyperlipidemia: Secondary | ICD-10-CM

## 2024-06-23 ENCOUNTER — Other Ambulatory Visit: Payer: Self-pay | Admitting: Family Medicine

## 2024-06-23 DIAGNOSIS — E782 Mixed hyperlipidemia: Secondary | ICD-10-CM

## 2024-06-23 MED ORDER — ROSUVASTATIN CALCIUM 10 MG PO TABS: 10.0000 mg | ORAL_TABLET | Freq: Every day | ORAL | 1 refills | Status: AC

## 2024-06-23 NOTE — Telephone Encounter (Signed)
 Copied from CRM #[id number]. Topic: Clinical - Medication Refill >> Jun 23, 2024 10:34 AM Kendralyn S wrote: Medication:  rosuvastatin  (CRESTOR ) 10 MG tablet   Has the patient contacted their pharmacy? Yes (Agent: If no, request that the patient contact the pharmacy for the refill. If patient does not wish to contact the pharmacy document the reason why and proceed with request.) (Agent: If yes, when and what did the pharmacy advise?)  This is the patient's preferred pharmacy:  [facility] - [place], [place] - [address] [place] [address] Phone: [phone] Fax: [phone]  Is this the correct pharmacy for this prescription? Yes If no, delete pharmacy and type the correct one.   Has the prescription been filled recently? No  Is the patient out of the medication? No  Has the patient been seen for an appointment in the last year OR does the patient have an upcoming appointment? Yes  Can we respond through MyChart? No  Agent: Please be advised that Rx refills may take up to 3 business days. We ask that you follow-up with your pharmacy.

## 2024-08-04 ENCOUNTER — Ambulatory Visit: Admitting: Family Medicine

## 2024-08-06 ENCOUNTER — Encounter (INDEPENDENT_AMBULATORY_CARE_PROVIDER_SITE_OTHER): Payer: Self-pay | Admitting: *Deleted

## 2024-08-07 ENCOUNTER — Other Ambulatory Visit: Payer: Self-pay | Admitting: Family Medicine

## 2024-08-07 DIAGNOSIS — I10 Essential (primary) hypertension: Secondary | ICD-10-CM

## 2024-08-18 ENCOUNTER — Other Ambulatory Visit: Payer: Self-pay | Admitting: Family Medicine

## 2024-08-18 DIAGNOSIS — I10 Essential (primary) hypertension: Secondary | ICD-10-CM

## 2024-08-19 ENCOUNTER — Ambulatory Visit: Admitting: Family Medicine

## 2024-08-24 ENCOUNTER — Telehealth: Payer: Self-pay | Admitting: Family Medicine

## 2024-08-24 NOTE — Telephone Encounter (Signed)
 Contacted pt to r/s appt on 08/25/2024 at 9:50 am to 10:10am due to office not opening until 10 am on 08/25/2024. Pt agreed to the 10:10 am opening on the schedule so I placed her in that slot.

## 2024-08-25 ENCOUNTER — Ambulatory Visit: Admitting: Family Medicine

## 2024-08-25 ENCOUNTER — Encounter: Payer: Self-pay | Admitting: Family Medicine

## 2024-08-25 VITALS — Resp 16 | Ht 62.0 in | Wt 137.0 lb

## 2024-08-25 DIAGNOSIS — M4185 Other forms of scoliosis, thoracolumbar region: Secondary | ICD-10-CM | POA: Diagnosis not present

## 2024-08-25 DIAGNOSIS — Z9189 Other specified personal risk factors, not elsewhere classified: Secondary | ICD-10-CM

## 2024-08-25 DIAGNOSIS — M48061 Spinal stenosis, lumbar region without neurogenic claudication: Secondary | ICD-10-CM | POA: Diagnosis not present

## 2024-08-25 DIAGNOSIS — E039 Hypothyroidism, unspecified: Secondary | ICD-10-CM | POA: Diagnosis not present

## 2024-08-25 DIAGNOSIS — E118 Type 2 diabetes mellitus with unspecified complications: Secondary | ICD-10-CM | POA: Diagnosis not present

## 2024-08-25 DIAGNOSIS — E782 Mixed hyperlipidemia: Secondary | ICD-10-CM | POA: Diagnosis not present

## 2024-08-25 DIAGNOSIS — Z136 Encounter for screening for cardiovascular disorders: Secondary | ICD-10-CM

## 2024-08-25 LAB — HEMOGLOBIN A1C
Est. average glucose Bld gHb Est-mCnc: 157 mg/dL
Hgb A1c MFr Bld: 7.1 % — ABNORMAL HIGH (ref 4.8–5.6)

## 2024-08-25 MED ORDER — HYDROCODONE-ACETAMINOPHEN 5-325 MG PO TABS: 1.0000 | ORAL_TABLET | Freq: Four times a day (QID) | ORAL | 0 refills | Status: AC | PRN

## 2024-08-25 NOTE — Assessment & Plan Note (Signed)
 Her last A1c in October 2025 was 6.5%.  She is on Trulicity  4.5 mg taken once a week Jardiance  25 mg and metformin  1000 twice daily.  She is concerned that Trulicity  may not be working any longer because she has gained 6 pounds recently.  Will check her A1c.  No higher dose of Trulicity .  Might could change her to a different GLP-1 however she would have to start over

## 2024-08-25 NOTE — Assessment & Plan Note (Signed)
 She is on Crestor  10 mg daily and has excellent control of her cholesterol 05/04/2024 total 113, Triggs 87, HDL 45 and LDL 51.  She is tolerating Crestor  without difficulty.

## 2024-08-25 NOTE — Assessment & Plan Note (Signed)
 Very severe scoliosis and bilateral sciatica.  Takes minimal pain medication so that she can work.  Has stopped lifting at work because it was aggravating her back.  Uses Vicks vapor rub at night which she finds efficacious in stopping her back pain, buttock and thigh pain bilaterally.

## 2024-08-25 NOTE — Progress Notes (Signed)
 "  Established Patient Office Visit  Subjective   Patient ID: Ashley Delgado, female    DOB: 03-Oct-1960  Age: 64 y.o. MRN: [id number]  Chief Complaint  Patient presents with   Diabetes    HPI   Discussed the use of AI scribe software for clinical note transcription with the patient, who gave verbal consent to proceed.  History of Present Illness   Ashley Delgado is a 64 year old female with polycythemia evaluted byt Hematology, no source found), alopecia, DMT2, mixed hyperlipidemia, hypothyroidism (thyroid cancer status post thyroidectomy and RAI) with bilateral lumbar radiculopathy and spinal stenosis who presents with concerns about her thyroid and diabetes management.  She has a history of thyroid cancer, treated with a thyroidectomy in 2013 followed by radioactive iodine treatment. She takes thyroid medication daily. Recently, she experienced a sore throat on one side of her throat for about three days, which has since resolved, causing concern due to her cancer history.  She is concerned about her diabetes management, noting a weight gain of seven pounds and feeling that her current medication, Trulicity  4.5 mg weekly, is no longer effective. She does not regularly check her blood sugars but is on Jardiance  and metformin  twice daily. She previously lost thirty pounds slowly while on Trulicity .  She experiences arthritis in her hands, particularly affecting her right hand, which is painful, especially in cold weather. Today, the pain is not as severe.  She experiences significant back pain that radiates to her buttocks and hips, requiring her to take one to two pain pills daily. She uses Vicks Vapor Rub for relief at night, which helps her sleep.  She mentions issues with her pharmacy regarding medication refills, specifically with gabapentin , and requests assistance in ensuring her medications are up to date.  No swelling in legs and no current throat pain.      Ashley Delgado is a 64 year  old female with thyroid cancer and diabetes who presents with concerns about her thyroid and diabetes management.  She has a history of thyroid cancer, treated with a thyroidectomy in 2013 followed by radioactive iodine treatment. She takes thyroid medication daily. Recently, she experienced a sore throat on one side for about three days, which has since resolved, causing concern due to her cancer history.  She is concerned about her diabetes management, noting a weight gain of seven pounds and feeling that her current medication, Trulicity , is no longer effective. She does not regularly check her blood sugars but is on Jardiance  and metformin  twice daily. She previously lost thirty pounds slowly while on Trulicity .  She experiences arthritis in her hands, particularly affecting her right hand, which is painful, especially in cold weather. Today, the pain is not as severe.  She experiences significant back pain that radiates to her buttocks and hips, requiring her to take one to two pain pills daily. She uses Vicks Vapor Rub for relief at night, which helps her sleep.  She mentions issues with her pharmacy regarding medication refills, specifically with gabapentin , and requests assistance in ensuring her medications are up to date.  No swelling in legs and no current throat pain.    Objective:     Resp 16   Ht 5' 2 (1.575 m)   Wt 137 lb (62.1 kg)   BMI 25.06 kg/m    Physical Exam Vitals and nursing note reviewed.  Constitutional:      Appearance: Normal appearance.  HENT:     Head: Normocephalic and atraumatic.  Eyes:  Conjunctiva/sclera: Conjunctivae normal.  Cardiovascular:     Rate and Rhythm: Normal rate and regular rhythm.  Pulmonary:     Effort: Pulmonary effort is normal.     Breath sounds: Normal breath sounds.  Musculoskeletal:     Right lower leg: No edema.     Left lower leg: No edema.  Skin:    General: Skin is warm and dry.  Neurological:     Mental Status:  She is alert and oriented to person, place, and time.  Psychiatric:        Mood and Affect: Mood normal.        Behavior: Behavior normal.        Thought Content: Thought content normal.        Judgment: Judgment normal.          No results found for any visits on 08/25/24.    The ASCVD Risk score (Arnett DK, et al., 2019) failed to calculate for the following reasons:   The valid total cholesterol range is 130 to 320 mg/dL    Assessment & Plan:  Controlled diabetes mellitus type 2 with complications [facility]) Assessment & Plan: Her last A1c in October 2025 was 6.5%.  She is on Trulicity  4.5 mg taken once a week Jardiance  25 mg and metformin  1000 twice daily.  She is concerned that Trulicity  may not be working any longer because she has gained 6 pounds recently.  Will check her A1c.  No higher dose of Trulicity .  Might could change her to a different GLP-1 however she would have to start over  Orders: -     Hemoglobin A1c  Other form of scoliosis of thoracolumbar spine -     HYDROcodone -Acetaminophen ; Take 1 tablet by mouth every 6 (six) hours as needed for moderate pain (pain score 4-6) (scoliosis).  Dispense: 60 tablet; Refill: 0 -     HYDROcodone -Acetaminophen ; Take 1 tablet by mouth every 6 (six) hours as needed for moderate pain (pain score 4-6) (scoliosis).  Dispense: 60 tablet; Refill: 0 -     HYDROcodone -Acetaminophen ; Take 1 tablet by mouth every 6 (six) hours as needed for moderate pain (pain score 4-6) (scoliosis).  Dispense: 60 tablet; Refill: 0  Hypothyroidism (acquired) Assessment & Plan: Had a thyroidectomy for thyroid cancer in 2013 and then had RAI.  Had a sore throat which concerned her that it may be her thyroid cancer.  Consideration of referral to Endo versus thyroid ultrasound.   Encounter for screening for coronary artery disease in patient with risk for coronary artery disease greater than 20% in next 10 years Assessment & Plan: Discussed coronary calcium   score.  Currently total cholesterol 113, Triggs 87, HDL 45 and LDL 51.  She is already at goal for thyroid.  This would just give her a risk assessment for CAD.   Degenerative lumbar spinal stenosis Assessment & Plan: Very severe scoliosis and bilateral sciatica.  Takes minimal pain medication so that she can work.  Has stopped lifting at work because it was aggravating her back.  Uses Vicks vapor rub at night which she finds efficacious in stopping her back pain, buttock and thigh pain bilaterally.   Mixed hyperlipidemia Assessment & Plan: She is on Crestor  10 mg daily and has excellent control of her cholesterol 05/04/2024 total 113, Triggs 87, HDL 45 and LDL 51.  She is tolerating Crestor  without difficulty.      Return in about 3 months (around 11/23/2024).    Alianah Lofton K Ashland Osmer, MD "

## 2024-08-25 NOTE — Assessment & Plan Note (Signed)
 Discussed coronary calcium  score.  Currently total cholesterol 113, Triggs 87, HDL 45 and LDL 51.  She is already at goal for thyroid.  This would just give her a risk assessment for CAD.

## 2024-08-25 NOTE — Assessment & Plan Note (Addendum)
 Had a thyroidectomy for thyroid cancer in 2013 and then had RAI.  Had a sore throat which concerned her that it may be her thyroid cancer.  Consideration of referral to Endo versus thyroid ultrasound.

## 2024-08-26 ENCOUNTER — Ambulatory Visit: Payer: Self-pay | Admitting: Family Medicine

## 2024-11-23 ENCOUNTER — Ambulatory Visit: Admitting: Family Medicine
